# Patient Record
Sex: Male | Born: 1960 | Race: Black or African American | Hispanic: No | Marital: Married | Smoking: Never smoker
Health system: Southern US, Community
[De-identification: ages and names within clinical notes are randomized; demographics above are authoritative.]

## PROBLEM LIST (undated history)

## (undated) DIAGNOSIS — I10 Essential (primary) hypertension: Secondary | ICD-10-CM

## (undated) DIAGNOSIS — J45909 Unspecified asthma, uncomplicated: Secondary | ICD-10-CM

## (undated) DIAGNOSIS — Z87442 Personal history of urinary calculi: Secondary | ICD-10-CM

## (undated) DIAGNOSIS — N200 Calculus of kidney: Secondary | ICD-10-CM

## (undated) HISTORY — DX: Essential (primary) hypertension: I10

## (undated) HISTORY — DX: Calculus of kidney: N20.0

## (undated) HISTORY — DX: Unspecified asthma, uncomplicated: J45.909

---

## 2000-01-20 HISTORY — PX: ANAL FISSURE REPAIR: SHX2312

## 2003-01-20 HISTORY — PX: LITHOTRIPSY: SUR834

## 2005-11-11 ENCOUNTER — Observation Stay (HOSPITAL_COMMUNITY): Admission: RE | Admit: 2005-11-11 | Discharge: 2005-11-12 | Payer: Self-pay | Admitting: Urology

## 2010-09-15 ENCOUNTER — Encounter (INDEPENDENT_AMBULATORY_CARE_PROVIDER_SITE_OTHER): Payer: Self-pay | Admitting: *Deleted

## 2010-09-24 ENCOUNTER — Encounter: Payer: Self-pay | Admitting: Gastroenterology

## 2010-10-17 ENCOUNTER — Ambulatory Visit (AMBULATORY_SURGERY_CENTER): Payer: Self-pay | Admitting: *Deleted

## 2010-10-17 VITALS — Ht 75.0 in | Wt 253.2 lb

## 2010-10-17 DIAGNOSIS — Z1211 Encounter for screening for malignant neoplasm of colon: Secondary | ICD-10-CM

## 2010-10-17 MED ORDER — PEG-KCL-NACL-NASULF-NA ASC-C 100 G PO SOLR: ORAL | Status: DC

## 2010-10-31 ENCOUNTER — Ambulatory Visit (AMBULATORY_SURGERY_CENTER): Payer: BC Managed Care – PPO | Admitting: Gastroenterology

## 2010-10-31 ENCOUNTER — Encounter: Payer: Self-pay | Admitting: Gastroenterology

## 2010-10-31 VITALS — HR 71 | Temp 97.4°F | Resp 18 | Ht 75.0 in | Wt 253.0 lb

## 2010-10-31 DIAGNOSIS — K648 Other hemorrhoids: Secondary | ICD-10-CM

## 2010-10-31 DIAGNOSIS — K644 Residual hemorrhoidal skin tags: Secondary | ICD-10-CM

## 2010-10-31 DIAGNOSIS — Z1211 Encounter for screening for malignant neoplasm of colon: Secondary | ICD-10-CM

## 2010-10-31 DIAGNOSIS — K5730 Diverticulosis of large intestine without perforation or abscess without bleeding: Secondary | ICD-10-CM

## 2010-10-31 HISTORY — PX: COLONOSCOPY: SHX174

## 2010-10-31 MED ORDER — SODIUM CHLORIDE 0.9 % IV SOLN: 500.0000 mL | INTRAVENOUS | Status: DC

## 2010-10-31 NOTE — Patient Instructions (Signed)
Please refer to blue and green discharge instruction sheets. 

## 2010-11-03 ENCOUNTER — Telehealth: Payer: Self-pay | Admitting: *Deleted

## 2010-11-03 NOTE — Telephone Encounter (Signed)
Spoke with Timothy Hahn who states that [name] was not there. She states that "he did great" and they had no questions or concerns.

## 2014-12-18 ENCOUNTER — Ambulatory Visit (INDEPENDENT_AMBULATORY_CARE_PROVIDER_SITE_OTHER): Payer: BLUE CROSS/BLUE SHIELD | Admitting: Urology

## 2014-12-18 DIAGNOSIS — R350 Frequency of micturition: Secondary | ICD-10-CM

## 2014-12-18 DIAGNOSIS — R3915 Urgency of urination: Secondary | ICD-10-CM

## 2014-12-18 DIAGNOSIS — N401 Enlarged prostate with lower urinary tract symptoms: Secondary | ICD-10-CM | POA: Diagnosis not present

## 2015-02-19 ENCOUNTER — Ambulatory Visit (INDEPENDENT_AMBULATORY_CARE_PROVIDER_SITE_OTHER): Payer: BLUE CROSS/BLUE SHIELD | Admitting: Urology

## 2015-02-19 DIAGNOSIS — N401 Enlarged prostate with lower urinary tract symptoms: Secondary | ICD-10-CM

## 2016-02-04 ENCOUNTER — Ambulatory Visit (INDEPENDENT_AMBULATORY_CARE_PROVIDER_SITE_OTHER): Payer: BLUE CROSS/BLUE SHIELD | Admitting: Urology

## 2016-02-04 DIAGNOSIS — N5201 Erectile dysfunction due to arterial insufficiency: Secondary | ICD-10-CM | POA: Diagnosis not present

## 2016-02-04 DIAGNOSIS — N401 Enlarged prostate with lower urinary tract symptoms: Secondary | ICD-10-CM

## 2017-11-05 ENCOUNTER — Other Ambulatory Visit (HOSPITAL_COMMUNITY): Payer: Self-pay | Admitting: Nurse Practitioner

## 2017-11-05 DIAGNOSIS — R1906 Epigastric swelling, mass or lump: Secondary | ICD-10-CM

## 2017-11-08 ENCOUNTER — Ambulatory Visit (HOSPITAL_COMMUNITY)
Admission: RE | Admit: 2017-11-08 | Discharge: 2017-11-08 | Disposition: A | Discharge status: Home / Self Care | Payer: Commercial Managed Care - PPO | Source: Ambulatory Visit | Attending: Nurse Practitioner | Admitting: Nurse Practitioner

## 2017-11-08 ENCOUNTER — Encounter (HOSPITAL_COMMUNITY): Payer: Self-pay

## 2017-11-08 DIAGNOSIS — N281 Cyst of kidney, acquired: Secondary | ICD-10-CM | POA: Diagnosis not present

## 2017-11-08 DIAGNOSIS — R1900 Intra-abdominal and pelvic swelling, mass and lump, unspecified site: Secondary | ICD-10-CM | POA: Insufficient documentation

## 2017-11-08 DIAGNOSIS — I2510 Atherosclerotic heart disease of native coronary artery without angina pectoris: Secondary | ICD-10-CM | POA: Insufficient documentation

## 2017-11-08 DIAGNOSIS — K8020 Calculus of gallbladder without cholecystitis without obstruction: Secondary | ICD-10-CM | POA: Diagnosis not present

## 2017-11-08 DIAGNOSIS — K769 Liver disease, unspecified: Secondary | ICD-10-CM | POA: Diagnosis not present

## 2017-11-08 DIAGNOSIS — K5710 Diverticulosis of small intestine without perforation or abscess without bleeding: Secondary | ICD-10-CM | POA: Diagnosis not present

## 2017-11-08 DIAGNOSIS — R1906 Epigastric swelling, mass or lump: Secondary | ICD-10-CM

## 2017-11-08 LAB — POCT I-STAT CREATININE: CREATININE: 1.2 mg/dL (ref 0.61–1.24)

## 2017-11-08 MED ORDER — IOPAMIDOL (ISOVUE-300) INJECTION 61%
100.0000 mL | Freq: Once | INTRAVENOUS | Status: AC | PRN
  Administered 2017-11-08: 100 mL via INTRAVENOUS

## 2017-11-15 ENCOUNTER — Other Ambulatory Visit (HOSPITAL_COMMUNITY): Payer: Self-pay | Admitting: Internal Medicine

## 2017-11-15 DIAGNOSIS — K7689 Other specified diseases of liver: Secondary | ICD-10-CM

## 2017-11-18 ENCOUNTER — Ambulatory Visit (HOSPITAL_COMMUNITY)
Admission: RE | Admit: 2017-11-18 | Discharge: 2017-11-18 | Disposition: A | Discharge status: Home / Self Care | Payer: Commercial Managed Care - PPO | Source: Ambulatory Visit | Attending: Internal Medicine | Admitting: Internal Medicine

## 2017-11-18 DIAGNOSIS — K8020 Calculus of gallbladder without cholecystitis without obstruction: Secondary | ICD-10-CM | POA: Diagnosis not present

## 2017-11-18 DIAGNOSIS — I517 Cardiomegaly: Secondary | ICD-10-CM | POA: Diagnosis not present

## 2017-11-18 DIAGNOSIS — J90 Pleural effusion, not elsewhere classified: Secondary | ICD-10-CM | POA: Diagnosis not present

## 2017-11-18 DIAGNOSIS — K7689 Other specified diseases of liver: Secondary | ICD-10-CM | POA: Diagnosis present

## 2017-11-18 DIAGNOSIS — K5730 Diverticulosis of large intestine without perforation or abscess without bleeding: Secondary | ICD-10-CM | POA: Diagnosis not present

## 2017-11-18 DIAGNOSIS — I700 Atherosclerosis of aorta: Secondary | ICD-10-CM | POA: Insufficient documentation

## 2017-11-18 DIAGNOSIS — N281 Cyst of kidney, acquired: Secondary | ICD-10-CM | POA: Diagnosis not present

## 2017-11-18 MED ORDER — GADOBUTROL 1 MMOL/ML IV SOLN
20.0000 mL | Freq: Once | INTRAVENOUS | Status: AC | PRN
  Administered 2017-11-18: 10 mL via INTRAVENOUS

## 2019-01-11 IMAGING — MR MR ABDOMEN WO/W CM
9 of 18 series · 21 of 48 positions shown · IV contrast (10ml gadavist)
Comparison: Multiple exams, including 11/08/2017

CLINICAL DATA: Indeterminate liver lesions on recent CT abdomen of
11/08/2017 for definitive characterization.

EXAM:
MRI ABDOMEN WITHOUT AND WITH CONTRAST
TECHNIQUE: Multiplanar multisequence MR imaging of the abdomen was performed
both before and after the administration of intravenous contrast.
CONTRAST:  10 cc Gadavist

[Series 4: T2 · coronal · 5.0mm · 1.20mm/px · 1 of 40 slices shown]
[im 1/40]
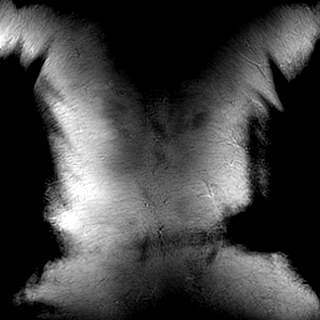

[Series 5: t2fs axial bh · axial · 5.0mm · 1.50mm/px · z∈[-57,+177]mm · 2 of 40 slices shown]
[im 1/40]
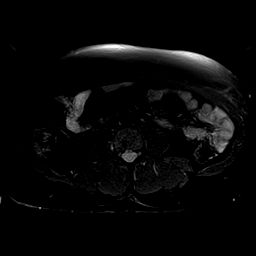
[im 40/40]
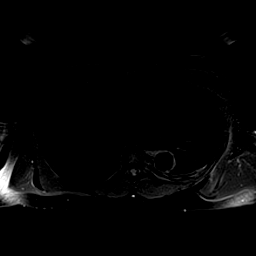

[Series 6: bSSFP · axial · 4.0mm · 0.68mm/px · z∈[-91,+185]mm · 3 of 70 slices shown]
[im 1/70]
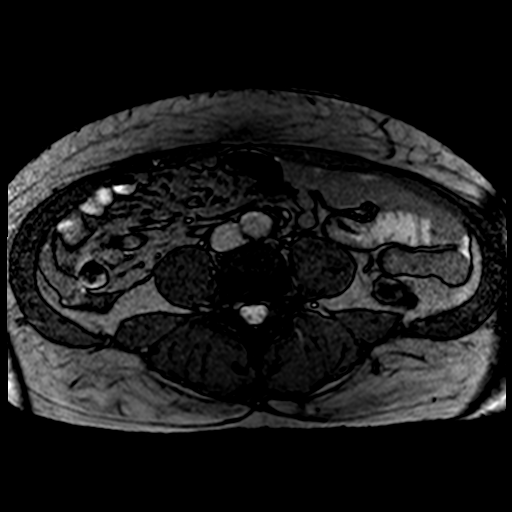
[im 35/70]
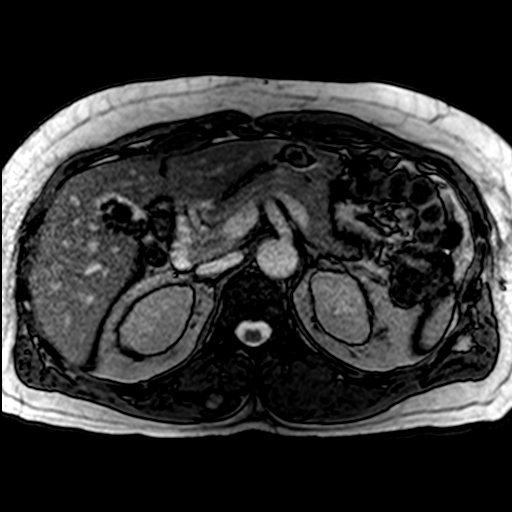
[im 70/70]
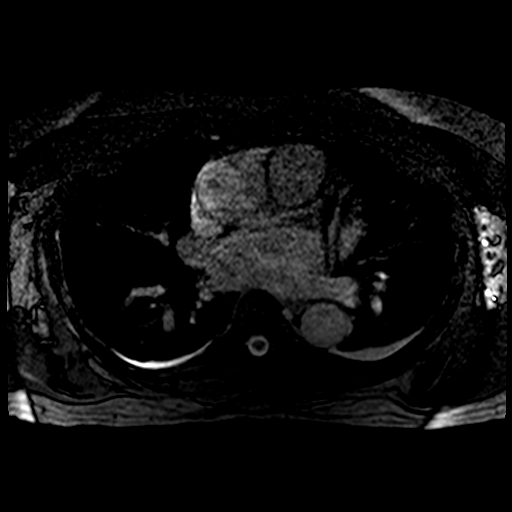

[Series 7: DWI · axial · 5.0mm · 0.99mm/px · z∈[-88,+182]mm · 3 of 92 slices shown (1 of 2)]
[im 1/92]
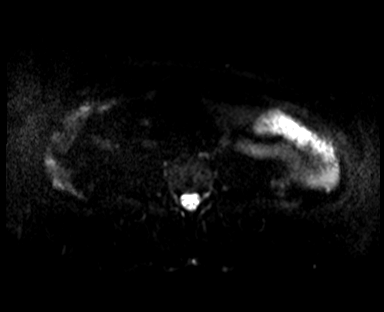
[im 46/92]
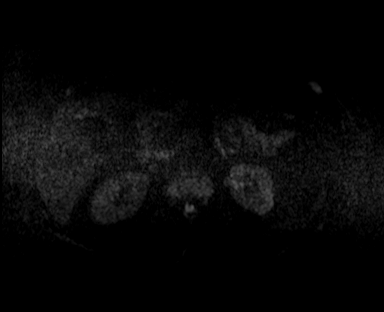
[im 92/92]
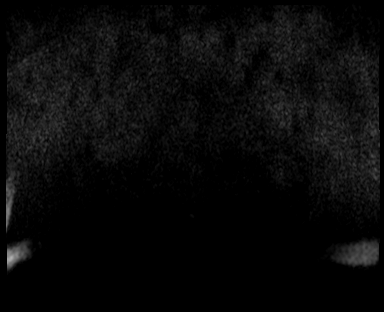

[Series 8: DWI · axial · 5.0mm · 0.99mm/px · z∈[-88,+182]mm · 2 of 46 slices shown (2 of 2)]
[im 1/46]
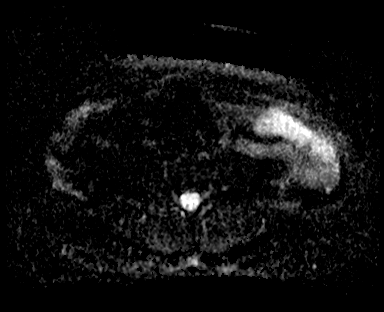
[im 46/46]
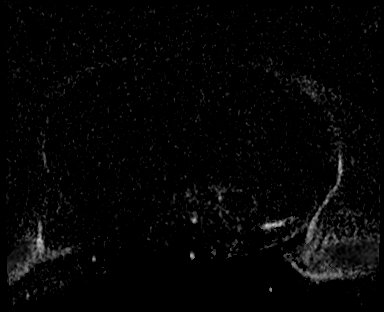

[Series 9: T1 · axial · 4.0mm · 0.59mm/px · z∈[-128,+124]mm · 2 of 64 slices shown (1 of 3)]
[im 1/64]
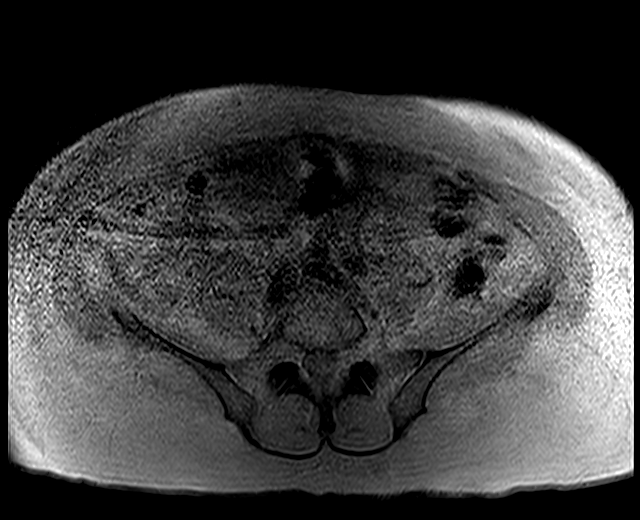
[im 64/64]
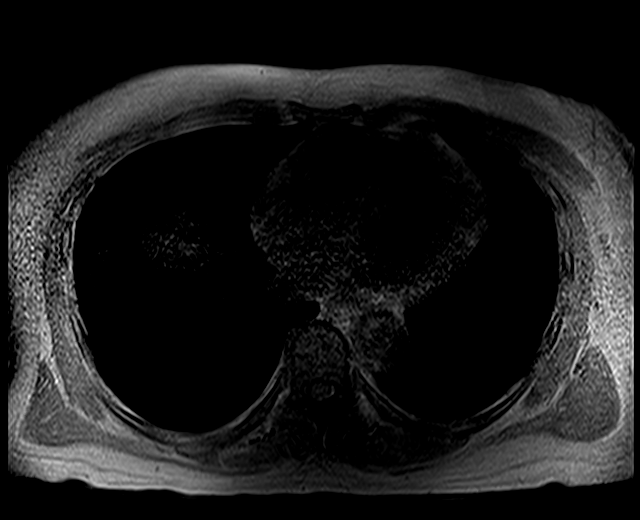

[Series 10: T1 · axial · 4.0mm · 0.59mm/px · z∈[-128,+124]mm · 2 of 64 slices shown (2 of 3)]
[im 1/64]
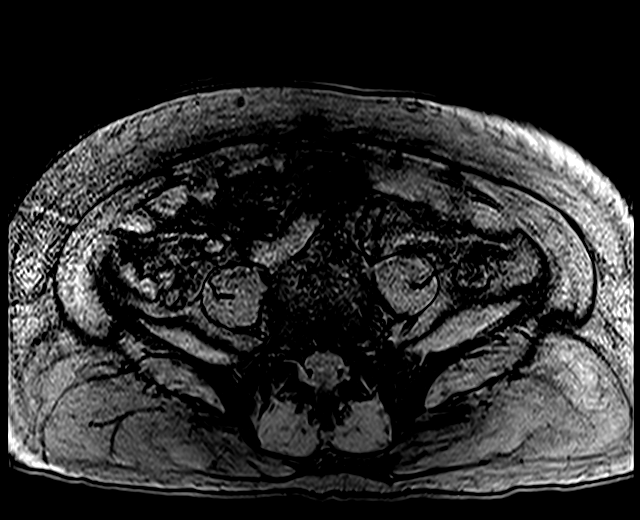
[im 64/64]
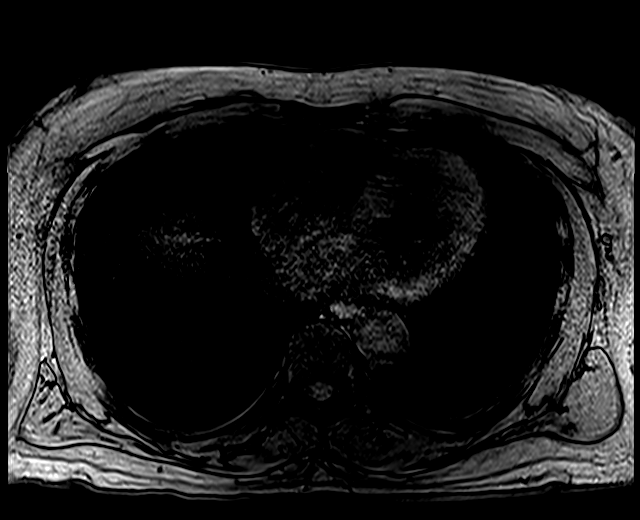

[Series 13: T1 · axial · non-contrast · 3.7mm · 0.62mm/px · z∈[-149,+144]mm · 3 of 80 slices shown (3 of 3)]
[im 1/80]
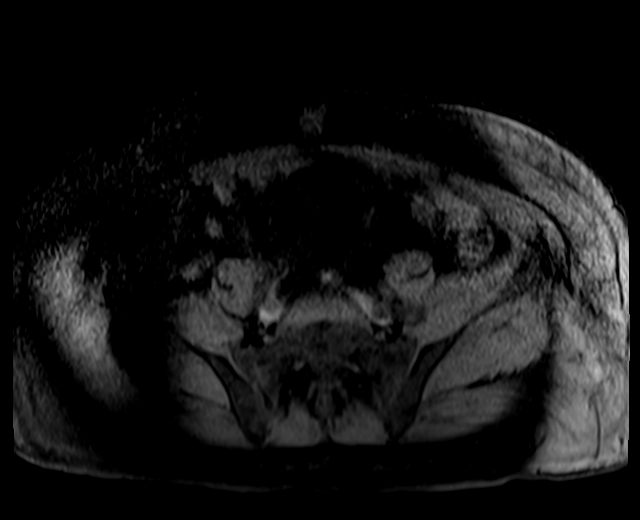
[im 40/80]
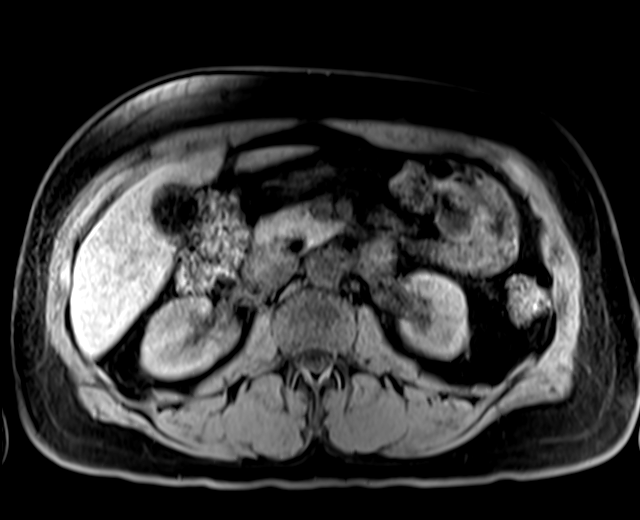
[im 80/80]
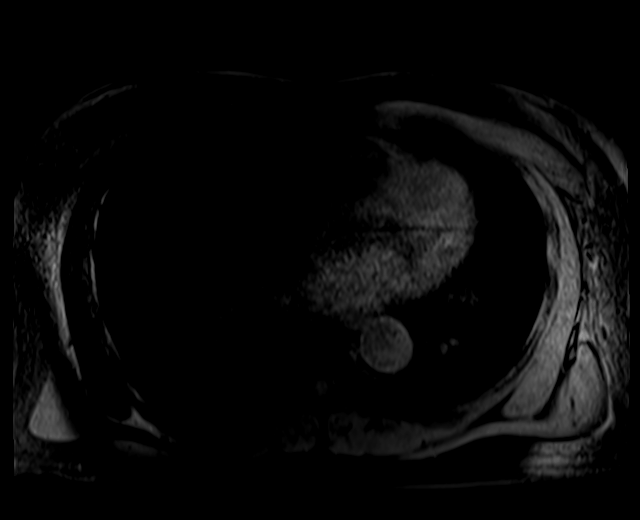

[Series 14: T1 post-contrast · axial · 3.7mm · 0.62mm/px · z∈[-149,+144]mm · 3 of 80 slices shown]
[im 1/80]
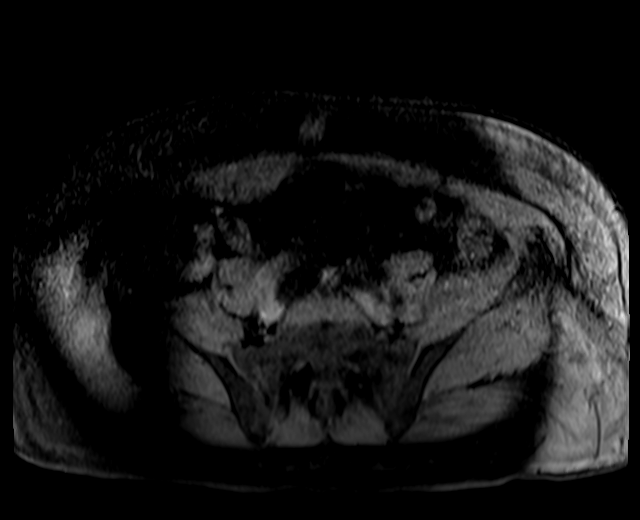
[im 40/80]
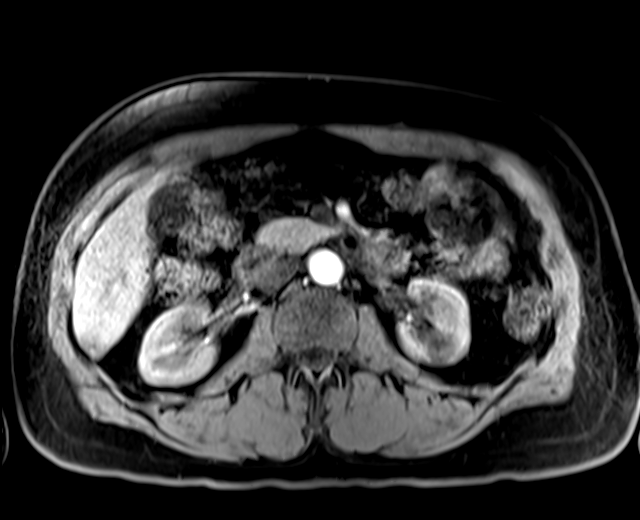
[im 80/80]
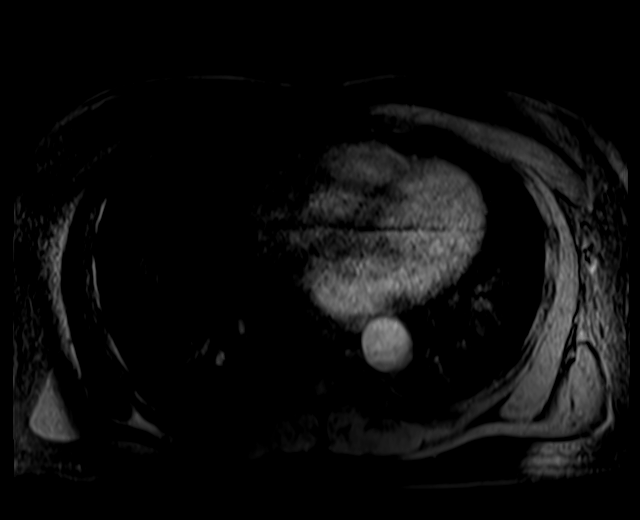

[21 of 48 positions shown; findings below may reference images not displayed]

FINDINGS: Despite efforts by the technologist and patient, motion artifact is
present on today's exam and could not be eliminated. This reduces
exam sensitivity and specificity.

Lower chest: Trace bilateral pleural effusions.  Mild cardiomegaly.

Hepatobiliary: The 3 lesions in the liver demonstrate faintly
increased T2 signal, reduced T1 signal, and indistinct enhancement
on later phase images along with delayed enhancement on the
five-minute images. Moreover, these lesions were larger and had
characteristic enhancement pattern for hemangioma is back on
06/03/2006, 11 years ago. Appearance compatible with hemangiomas,
likely with mildly atypical characteristics due to interval
involution.

Numerous gallstones fill the gallbladder. These are of variable size
but as large as 1.6 cm in long axis. No biliary dilatation. No new
liver lesions.

Pancreas:  Unremarkable

Spleen:  Unremarkable

Adrenals/Urinary Tract: Small left renal cysts include a 7 mm
Bosniak category 2 cyst of the left kidney upper pole on image
35/13. The right kidney appears unremarkable. Adrenal glands normal.

Stomach/Bowel: Sigmoid colon diverticulosis. Normal appendix.
Periampullary duodenal diverticulum suspected.

Vascular/Lymphatic:  Aortoiliac atherosclerotic vascular disease.

Other:  No supplemental non-categorized findings.

Musculoskeletal: Disc desiccation at L5-S1.
IMPRESSION: 1. The 3 liver lesions seen on the CT scan are thought to represent
involuted hemangiomas. These have delayed enhancement, faintly
increased T2 signal, and were larger and had a classic hemangioma
appearance on a remote prior CT scan from 06/03/2006.
2. Numerous gallstones fill the gallbladder.
3. Bosniak category 2 complex but benign 7 mm cyst of the left
kidney upper pole.
4. Trace bilateral pleural effusions with mild cardiomegaly.
5.  Aortic Atherosclerosis ([id number]).
6. Sigmoid colon diverticulosis.

## 2020-08-07 ENCOUNTER — Encounter: Payer: Self-pay | Admitting: Gastroenterology

## 2020-10-15 ENCOUNTER — Other Ambulatory Visit: Payer: Self-pay

## 2020-10-15 ENCOUNTER — Encounter: Payer: Self-pay | Admitting: Urology

## 2020-10-15 ENCOUNTER — Ambulatory Visit (INDEPENDENT_AMBULATORY_CARE_PROVIDER_SITE_OTHER): Payer: Commercial Managed Care - PPO | Admitting: Urology

## 2020-10-15 VITALS — BP 173/120 | HR 60 | Temp 98.0°F | Wt 227.0 lb

## 2020-10-15 DIAGNOSIS — R339 Retention of urine, unspecified: Secondary | ICD-10-CM | POA: Diagnosis not present

## 2020-10-15 DIAGNOSIS — N400 Enlarged prostate without lower urinary tract symptoms: Secondary | ICD-10-CM

## 2020-10-15 LAB — URINALYSIS, ROUTINE W REFLEX MICROSCOPIC
Bilirubin, UA: NEGATIVE
Glucose, UA: NEGATIVE
Ketones, UA: NEGATIVE
Leukocytes,UA: NEGATIVE
Nitrite, UA: NEGATIVE
Protein,UA: NEGATIVE
RBC, UA: NEGATIVE
Specific Gravity, UA: 1.025 (ref 1.005–1.030)
Urobilinogen, Ur: 0.2 mg/dL (ref 0.2–1.0)
pH, UA: 5.5 (ref 5.0–7.5)

## 2020-10-15 NOTE — Progress Notes (Signed)
Urological Symptom Review  Patient is experiencing the following symptoms: Frequent urination Hard to postpone urination Get up at night to urinate Trouble starting stream Have to strain to urinate Weak stream   Review of Systems  Gastrointestinal (upper)  : Negative for upper GI symptoms  Gastrointestinal (lower) : Negative for lower GI symptoms  Constitutional : Negative for symptoms  Skin: Negative for skin symptoms  Eyes: Negative for eye symptoms  Ear/Nose/Throat : Negative for Ear/Nose/Throat symptoms  Hematologic/Lymphatic: Negative for Hematologic/Lymphatic symptoms  Cardiovascular : Negative for cardiovascular symptoms  Respiratory : Negative for respiratory symptoms  Endocrine: Negative for endocrine symptoms  Musculoskeletal: Negative for musculoskeletal symptoms  Neurological: Negative for neurological symptoms  Psychologic: Negative for psychiatric symptoms

## 2020-10-15 NOTE — Progress Notes (Signed)
History of Present Illness: 59-year-old male presents for follow-up of BPH.  I first saw him back in 2016.  At that time he had significant lower urinary tract symptoms.  He has been on maximum dose of tamsulosin since that time.  His last visit was over 3 years ago.  Since that time, he has had worsening of urinary symptomatology.  He is on tamsulosin 0.4 mg 2 a day.  With that, IPSS 17, quality-of-life score 4.  At his visit in 2016 he did have cystoscopy which revealed an obstructive prostate.  Past Medical History:  Diagnosis Date   Gout    Hypertension    Kidney stones     Past Surgical History:  Procedure Laterality Date   ANAL FISSURE REPAIR  2002   LITHOTRIPSY  2005   kidney stones    Home Medications:  Allergies as of 10/15/2020   No Known Allergies      Medication List        Accurate as of October 15, 2020 10:54 AM. If you have any questions, ask your nurse or doctor.          amLODipine 5 MG tablet Commonly known as: NORVASC Take 5 mg by mouth daily.   aspirin 81 MG tablet Take 81 mg by mouth daily.   benazepril 40 MG tablet Commonly known as: LOTENSIN Take 40 mg by mouth daily.   tamsulosin 0.4 MG Caps capsule Commonly known as: FLOMAX 0.4 mg daily after supper.        Allergies: No Known Allergies  Family History  Problem Relation Age of Onset   Multiple myeloma Mother    ALS Sister    Colon cancer Neg Hx    Stomach cancer Neg Hx    Rectal cancer Neg Hx     Social History:  reports that he does not drink alcohol and does not use drugs. No history on file for tobacco use.  ROS: A complete review of systems was performed.  All systems are negative except for pertinent findings as noted.  Physical Exam:  Vital signs in last 24 hours: There were no vitals taken for this visit. Constitutional:  Alert and oriented, No acute distress Cardiovascular: Regular rate  Respiratory: Normal respiratory effort GI: Abdomen is soft,  nontender, nondistended, no abdominal masses. No CVAT.  Genitourinary: Normal male phallus, testes are descended bilaterally and non-tender and without masses, scrotum is normal in appearance without lesions or masses, perineum is normal on inspection.  Prostate 50 g.  Normal anal sphincter tone.  No prostatic nodularity Lymphatic: No lymphadenopathy Neurologic: Grossly intact, no focal deficits Psychiatric: Normal mood and affect  I have reviewed prior pt notes  I have reviewed notes from referring/previous physicians--Dr. Vyas  I have independently reviewed bladder scan--427 mL  I have reviewed prior PSA results    Impression/Assessment:  1.  BPH with significant lower urinary tract symptoms, incomplete emptying, on maximal medical therapy  2.  Screening for prostate cancer  Plan:  1.  PSA is checked today  2.  I will have him come back for cystoscopy after prostate ultrasound.  We will discuss further intervention i.e. TURP versus UroLift, depending on the size of his prostate  

## 2020-10-16 LAB — PSA: Prostate Specific Ag, Serum: 0.6 ng/mL (ref 0.0–4.0)

## 2020-10-16 NOTE — Progress Notes (Signed)
Results sent via my chart 

## 2020-10-18 ENCOUNTER — Encounter: Payer: Self-pay | Admitting: Urology

## 2020-10-21 NOTE — Telephone Encounter (Signed)
Ultrasound appointment has been scheduled for Tues, 11/2 at 1:30 pm at [facility] radiology department.   Follow up appt with Dr. Retta Diones is scheduled for Wed. 11/3 at 2:00pm at the [facility] office in [place].   If you have any questions or need any additional information, please contact the office at [phone].

## 2020-10-30 ENCOUNTER — Other Ambulatory Visit: Payer: Self-pay

## 2020-10-30 ENCOUNTER — Ambulatory Visit (AMBULATORY_SURGERY_CENTER): Payer: Commercial Managed Care - PPO | Admitting: *Deleted

## 2020-10-30 ENCOUNTER — Encounter: Payer: Self-pay | Admitting: Gastroenterology

## 2020-10-30 VITALS — Ht 73.0 in | Wt 230.0 lb

## 2020-10-30 DIAGNOSIS — Z1211 Encounter for screening for malignant neoplasm of colon: Secondary | ICD-10-CM

## 2020-10-30 MED ORDER — PEG-KCL-NACL-NASULF-NA ASC-C 100 G PO SOLR: 1.0000 | Freq: Once | ORAL | 0 refills | Status: AC

## 2020-10-30 NOTE — Progress Notes (Signed)

## 2020-11-13 ENCOUNTER — Other Ambulatory Visit: Payer: Self-pay

## 2020-11-13 ENCOUNTER — Ambulatory Visit (AMBULATORY_SURGERY_CENTER): Payer: Commercial Managed Care - PPO | Admitting: Gastroenterology

## 2020-11-13 ENCOUNTER — Encounter: Payer: Self-pay | Admitting: Gastroenterology

## 2020-11-13 VITALS — BP 169/108 | HR 69 | Temp 99.6°F | Resp 10 | Ht 72.0 in | Wt 230.0 lb

## 2020-11-13 DIAGNOSIS — Z1211 Encounter for screening for malignant neoplasm of colon: Secondary | ICD-10-CM | POA: Diagnosis not present

## 2020-11-13 MED ORDER — SODIUM CHLORIDE 0.9 % IV SOLN: 500.0000 mL | Freq: Once | INTRAVENOUS | Status: DC

## 2020-11-13 NOTE — Progress Notes (Signed)
HPI: This is a man with routine risk for CRC   ROS: complete GI ROS as described in HPI, all other review negative.  Constitutional:  No unintentional weight loss   Past Medical History:  Diagnosis Date   Asthma    as a child only   Gout    Hypertension    Kidney stones     Past Surgical History:  Procedure Laterality Date   ANAL FISSURE REPAIR  01/20/2000   COLONOSCOPY  10/31/2010   Dr.Aarthi Uyeno   LITHOTRIPSY  01/20/2003   kidney stones    Current Outpatient Medications  Medication Sig Dispense Refill   acetaminophen (TYLENOL) 500 MG tablet Take 500 mg by mouth every 6 (six) hours as needed.     allopurinol (ZYLOPRIM) 300 MG tablet Take 300 mg by mouth daily.     carvedilol (COREG) 25 MG tablet Take 25 mg by mouth 2 (two) times daily.     Cholecalciferol (VITAMIN D3 PO) Take by mouth.     finasteride (PROSCAR) 5 MG tablet Take 5 mg by mouth daily.     hydrALAZINE (APRESOLINE) 25 MG tablet Take 25 mg by mouth 2 (two) times daily.     Tamsulosin HCl (FLOMAX) 0.4 MG CAPS 0.4 mg daily after supper.      Current Facility-Administered Medications  Medication Dose Route Frequency Provider Last Rate Last Admin   0.9 %  sodium chloride infusion  500 mL Intravenous Once Milus Banister, MD        Allergies as of 11/13/2020 - Review Complete 11/13/2020  Allergen Reaction Noted   Bactrim [sulfamethoxazole-trimethoprim] Nausea Only 10/30/2020   Benazepril hcl Swelling 10/30/2020   Norvasc [amlodipine] Other (See Comments) 10/30/2020    Family History  Problem Relation Age of Onset   Multiple myeloma Mother    ALS Sister    Colon cancer Neg Hx    Stomach cancer Neg Hx    Rectal cancer Neg Hx    Esophageal cancer Neg Hx    Colon polyps Neg Hx     Social History   Socioeconomic History   Marital status: Married    Spouse name: Not on file   Number of children: Not on file   Years of education: Not on file   Highest education level: Not on file  Occupational History    Not on file  Tobacco Use   Smoking status: Never   Smokeless tobacco: Never  Vaping Use   Vaping Use: Never used  Substance and Sexual Activity   Alcohol use: No   Drug use: No   Sexual activity: Not on file  Other Topics Concern   Not on file  Social History Narrative   Not on file   Social Determinants of Health   Financial Resource Strain: Not on file  Food Insecurity: Not on file  Transportation Needs: Not on file  Physical Activity: Not on file  Stress: Not on file  Social Connections: Not on file  Intimate Partner Violence: Not on file     Physical Exam: BP (!) 168/110   Pulse 72   Temp 99.6 F (37.6 C) (Skin)   Ht 6' (1.829 m)   Wt 230 lb (104.3 kg)   SpO2 98%   BMI 31.19 kg/m  Constitutional: generally well-appearing Psychiatric: alert and oriented x3 Lungs: CTA bilaterally Heart: no MCR  Assessment and plan: 60 y.o. male with routine risk for CRC  Screenign colonoscaopy today.  Care is appropriate for the ambulatory setting.  Quillian Quince  Ardis Hughs, MD [facility] 11/13/2020, 9:51 AM

## 2020-11-13 NOTE — Progress Notes (Signed)
VS by Falman  Pt's states no medical or surgical changes since previsit or office visit.  

## 2020-11-13 NOTE — Op Note (Signed)
[facility] Patient Name: Timothy Hahn Procedure Date: 11/13/2020 9:59 AM MRN: [id number] Endoscopist: Rachael Fee , MD Age: 60 Referring MD:  Date of Birth: Nov 29, 1960 Gender: Male Account #: [id number] Procedure:                Colonoscopy Indications:              Screening for colorectal malignant neoplasm Medicines:                Monitored Anesthesia Care Procedure:                Pre-Anesthesia Assessment:                           - Prior to the procedure, a History and Physical                            was performed, and patient medications and                            allergies were reviewed. The patient's tolerance of                            previous anesthesia was also reviewed. The risks                            and benefits of the procedure and the sedation                            options and risks were discussed with the patient.                            All questions were answered, and informed consent                            was obtained. Prior Anticoagulants: The patient has                            taken no previous anticoagulant or antiplatelet                            agents. ASA Grade Assessment: II - A patient with                            mild systemic disease. After reviewing the risks                            and benefits, the patient was deemed in                            satisfactory condition to undergo the procedure.                           After obtaining informed consent, the colonoscope  was passed under direct vision. Throughout the                            procedure, the patient's blood pressure, pulse, and                            oxygen saturations were monitored continuously. The                            Olympus CF-HQ190L [id number]) Colonoscope was                            introduced through the anus and advanced to the the                            cecum,  identified by appendiceal orifice and                            ileocecal valve. The colonoscopy was performed                            without difficulty. The patient tolerated the                            procedure well. The quality of the bowel                            preparation was good. The ileocecal valve,                            appendiceal orifice, and rectum were photographed. Scope In: 10:02:03 AM Scope Out: 10:12:57 AM Scope Withdrawal Time: 0 hours 7 minutes 12 seconds  Total Procedure Duration: 0 hours 10 minutes 54 seconds  Findings:                 Multiple small and large-mouthed diverticula were                            found in the left colon.                           The exam was otherwise without abnormality on                            direct and retroflexion views. Complications:            No immediate complications. Estimated blood loss:                            None. Estimated Blood Loss:     Estimated blood loss: none. Impression:               - Diverticulosis in the left colon.                           - The examination was otherwise normal on  direct                            and retroflexion views.                           - No polyps or cancers. Recommendation:           - Patient has a contact number available for                            emergencies. The signs and symptoms of potential                            delayed complications were discussed with the                            patient. Return to normal activities tomorrow.                            Written discharge instructions were provided to the                            patient.                           - Resume previous diet.                           - Continue present medications.                           - Repeat colonoscopy in 10 years for screening. Rachael Fee, MD 11/13/2020 10:15:04 AM This report has been signed electronically.

## 2020-11-13 NOTE — Progress Notes (Signed)
Pt Drowsy. VSS. To PACU, report to RN. No anesthetic complications noted.  

## 2020-11-13 NOTE — Patient Instructions (Signed)
Discharge instructions given. Handouts on Diverticulosis. Resume previous medications. YOU HAD AN ENDOSCOPIC PROCEDURE TODAY AT THE  ENDOSCOPY CENTER:   Refer to the procedure report that was given to you for any specific questions about what was found during the examination.  If the procedure report does not answer your questions, please call your gastroenterologist to clarify.  If you requested that your care partner not be given the details of your procedure findings, then the procedure report has been included in a sealed envelope for you to review at your convenience later.  YOU SHOULD EXPECT: Some feelings of bloating in the abdomen. Passage of more gas than usual.  Walking can help get rid of the air that was put into your GI tract during the procedure and reduce the bloating. If you had a lower endoscopy (such as a colonoscopy or flexible sigmoidoscopy) you may notice spotting of blood in your stool or on the toilet paper. If you underwent a bowel prep for your procedure, you may not have a normal bowel movement for a few days.  Please Note:  You might notice some irritation and congestion in your nose or some drainage.  This is from the oxygen used during your procedure.  There is no need for concern and it should clear up in a day or so.  SYMPTOMS TO REPORT IMMEDIATELY:  Following lower endoscopy (colonoscopy or flexible sigmoidoscopy):  Excessive amounts of blood in the stool  Significant tenderness or worsening of abdominal pains  Swelling of the abdomen that is new, acute  Fever of 100F or higher   For urgent or emergent issues, a gastroenterologist can be reached at any hour by calling [phone]. Do not use MyChart messaging for urgent concerns.    DIET:  We do recommend a small meal at first, but then you may proceed to your regular diet.  Drink plenty of fluids but you should avoid alcoholic beverages for 24 hours.  ACTIVITY:  You should plan to take it easy for  the rest of today and you should NOT DRIVE or use heavy machinery until tomorrow (because of the sedation medicines used during the test).    FOLLOW UP: Our staff will call the number listed on your records 48-72 hours following your procedure to check on you and address any questions or concerns that you may have regarding the information given to you following your procedure. If we do not reach you, we will leave a message.  We will attempt to reach you two times.  During this call, we will ask if you have developed any symptoms of COVID 19. If you develop any symptoms (ie: fever, flu-like symptoms, shortness of breath, cough etc.) before then, please call [phone].  If you test positive for Covid 19 in the 2 weeks post procedure, please call and report this information to [place].    If any biopsies were taken you will be contacted by phone or by letter within the next 1-3 weeks.  Please call us at [phone] if you have not heard about the biopsies in 3 weeks.    SIGNATURES/CONFIDENTIALITY: You and/or your care partner have signed paperwork which will be entered into your electronic medical record.  These signatures attest to the fact that that the information above on your After Visit Summary has been reviewed and is understood.  Full responsibility of the confidentiality of this discharge information lies with you and/or your care-partner.

## 2020-11-15 ENCOUNTER — Telehealth: Payer: Self-pay

## 2020-11-15 NOTE — Telephone Encounter (Signed)
  Follow up Call-  Call back number 11/13/2020  Post procedure Call Back phone  # [phone]  Permission to leave phone message Yes  Some recent data might be hidden     Patient questions:  Do you have a fever, pain , or abdominal swelling? No. Pain Score  0 *  Have you tolerated food without any problems? Yes.    Have you been able to return to your normal activities? Yes.    Do you have any questions about your discharge instructions: Diet   No. Medications  No. Follow up visit  No.  Do you have questions or concerns about your Care? No.  Actions: * If pain score is 4 or above: No action needed, pain <4.

## 2020-11-20 ENCOUNTER — Ambulatory Visit (HOSPITAL_COMMUNITY)
Admission: RE | Admit: 2020-11-20 | Discharge: 2020-11-20 | Disposition: A | Discharge status: Home / Self Care | Payer: Commercial Managed Care - PPO | Source: Ambulatory Visit | Attending: Urology | Admitting: Urology

## 2020-11-20 ENCOUNTER — Other Ambulatory Visit: Payer: Self-pay

## 2020-11-20 DIAGNOSIS — N400 Enlarged prostate without lower urinary tract symptoms: Secondary | ICD-10-CM | POA: Diagnosis not present

## 2020-11-25 NOTE — Progress Notes (Signed)
History of Present Illness: Here for cysto to complete BPH evaluation.  Recent prostate volume on U/S 23 mL.      Past Medical History:  Diagnosis Date   Asthma    as a child only   Gout    Hypertension    Kidney stones     Past Surgical History:  Procedure Laterality Date   ANAL FISSURE REPAIR  01/20/2000   COLONOSCOPY  10/31/2010   Dr.Jacobs   LITHOTRIPSY  01/20/2003   kidney stones    Home Medications:  Allergies as of 11/26/2020       Reactions   Bactrim [sulfamethoxazole-trimethoprim] Nausea Only   Benazepril Hcl Swelling   angioedema   Norvasc [amlodipine] Other (See Comments)   edema        Medication List        Accurate as of November 25, 2020  1:33 PM. If you have any questions, ask your nurse or doctor.          acetaminophen 500 MG tablet Commonly known as: TYLENOL Take 500 mg by mouth every 6 (six) hours as needed.   allopurinol 300 MG tablet Commonly known as: ZYLOPRIM Take 300 mg by mouth daily.   carvedilol 25 MG tablet Commonly known as: COREG Take 25 mg by mouth 2 (two) times daily.   finasteride 5 MG tablet Commonly known as: PROSCAR Take 5 mg by mouth daily.   hydrALAZINE 25 MG tablet Commonly known as: APRESOLINE Take 25 mg by mouth 2 (two) times daily.   tamsulosin 0.4 MG Caps capsule Commonly known as: FLOMAX 0.4 mg daily after supper.   VITAMIN D3 PO Take by mouth.        Allergies:  Allergies  Allergen Reactions   Bactrim [Sulfamethoxazole-Trimethoprim] Nausea Only   Benazepril Hcl Swelling    angioedema   Norvasc [Amlodipine] Other (See Comments)    edema    Family History  Problem Relation Age of Onset   Multiple myeloma Mother    ALS Sister    Colon cancer Neg Hx    Stomach cancer Neg Hx    Rectal cancer Neg Hx    Esophageal cancer Neg Hx    Colon polyps Neg Hx     Social History:  reports that he has never smoked. He has never used smokeless tobacco. He reports that he does not drink  alcohol and does not use drugs.  ROS: A complete review of systems was performed.  All systems are negative except for pertinent findings as noted.  Physical Exam:  Vital signs in last 24 hours: There were no vitals taken for this visit. Constitutional:  Alert and oriented, No acute distress Cardiovascular: Regular rate  Respiratory: Normal respiratory effort GI: Abdomen is soft, nontender, nondistended, no abdominal masses. No CVAT.  Genitourinary: Normal male phallus, testes are descended bilaterally and non-tender and without masses, scrotum is normal in appearance without lesions or masses, perineum is normal on inspection. Lymphatic: No lymphadenopathy Neurologic: Grossly intact, no focal deficits Psychiatric: Normal mood and affect  I have reviewed prior pt notes  I have reviewed urinalysis results  I have independently reviewed prior imaging  I have reviewed prior PSA results   Cystoscopy Procedure Note:  Indication: BPH evaluation  After informed consent and discussion of the procedure and its risks, Timothy Hahn was positioned and prepped in the standard fashion.  Cystoscopy was performed with a flexible cystoscope.   Findings: Urethra: No lesions/stricture Prostate: Obstructive with bilobar hypertrophy.  Slight  elevated bladder neck Bladder neck: No contracture Ureteral orifices: Normal bilaterally Bladder: Mild to moderate trabeculations.  Small diverticulum at the left dome.  Urothelium normal.  The patient tolerated the procedure well.     Impression/Assessment:  BPH with obstruction, worsening  Plan:  I discussed treatment options with the patient, TURP, TUIP, UroLift, Rezum.  He would like to consider minimally invasive procedures such as UroLift.  I discussed the procedure, risk, complication and expected outcomes.  He will research these, and get back with [place] about which way he would like to proceed.

## 2020-11-26 ENCOUNTER — Other Ambulatory Visit: Payer: Self-pay

## 2020-11-26 ENCOUNTER — Ambulatory Visit (INDEPENDENT_AMBULATORY_CARE_PROVIDER_SITE_OTHER): Payer: Commercial Managed Care - PPO | Admitting: Urology

## 2020-11-26 ENCOUNTER — Encounter: Payer: Self-pay | Admitting: Urology

## 2020-11-26 VITALS — BP 177/115 | HR 62

## 2020-11-26 DIAGNOSIS — R339 Retention of urine, unspecified: Secondary | ICD-10-CM | POA: Diagnosis not present

## 2020-11-26 DIAGNOSIS — N400 Enlarged prostate without lower urinary tract symptoms: Secondary | ICD-10-CM | POA: Diagnosis not present

## 2020-11-26 LAB — URINALYSIS, ROUTINE W REFLEX MICROSCOPIC
Bilirubin, UA: NEGATIVE
Glucose, UA: NEGATIVE
Ketones, UA: NEGATIVE
Leukocytes,UA: NEGATIVE
Nitrite, UA: NEGATIVE
Protein,UA: NEGATIVE
RBC, UA: NEGATIVE
Specific Gravity, UA: 1.025 (ref 1.005–1.030)
Urobilinogen, Ur: 0.2 mg/dL (ref 0.2–1.0)
pH, UA: 7 (ref 5.0–7.5)

## 2020-11-26 MED ORDER — CIPROFLOXACIN HCL 500 MG PO TABS
500.0000 mg | ORAL_TABLET | Freq: Once | ORAL | Status: AC
  Administered 2020-11-26: 500 mg via ORAL

## 2020-11-26 NOTE — Progress Notes (Signed)
Urological Symptom Review ° °Patient is experiencing the following symptoms: °Frequent urination °Hard to postpone urination °Get up at night to urinate °Stream starts and stops °Trouble starting stream °Have to strain to urinate °Weak stream ° ° °Review of Systems ° °Gastrointestinal (upper)  : °Negative for upper GI symptoms ° °Gastrointestinal (lower) : °Negative for lower GI symptoms ° °Constitutional : °Negative for symptoms ° °Skin: °Negative for skin symptoms ° °Eyes: °Negative for eye symptoms ° °Ear/Nose/Throat : °Negative for Ear/Nose/Throat symptoms ° °Hematologic/Lymphatic: °Negative for Hematologic/Lymphatic symptoms ° °Cardiovascular : °Negative for cardiovascular symptoms ° °Respiratory : °Negative for respiratory symptoms ° °Endocrine: °Negative for endocrine symptoms ° °Musculoskeletal: °Negative for musculoskeletal symptoms ° °Neurological: °Negative for neurological symptoms ° °Psychologic: °Negative for psychiatric symptoms ° °

## 2020-12-02 ENCOUNTER — Telehealth: Payer: Self-pay

## 2020-12-02 NOTE — Telephone Encounter (Signed)
Pt left a Voice Message:  Needing to make a TUB appt.  Please advise.  Call back:  [phone]  Thanks, Rosey Bath

## 2020-12-03 NOTE — Telephone Encounter (Signed)
Patient wishes to proceed with TURP.

## 2020-12-20 ENCOUNTER — Telehealth: Payer: Self-pay

## 2020-12-20 NOTE — Telephone Encounter (Signed)
Patient call office this am to inquire about surgery.  Patient informed Dr. Retta Diones surgery scheduler, Monica Martinez, will reach out to him to have surgery scheduled. Patient voiced understanding.  Received email today from [facility] asking for patient OV to proceed with scheduling surgery. Appropriate documents faxed to [facility].

## 2020-12-23 ENCOUNTER — Other Ambulatory Visit: Payer: Self-pay | Admitting: Urology

## 2021-01-16 ENCOUNTER — Other Ambulatory Visit: Payer: Self-pay

## 2021-01-16 ENCOUNTER — Encounter (HOSPITAL_BASED_OUTPATIENT_CLINIC_OR_DEPARTMENT_OTHER): Payer: Self-pay | Admitting: Urology

## 2021-01-16 NOTE — Progress Notes (Signed)
Spoke w/ via phone for pre-op interview---pt  Lab needs dos----   EKG and I stat           Lab results------n/a COVID test -----patient states asymptomatic no test needed Arrive at -------1045 NPO after MN NO Solid Food.  Clear liquids from MN until--- Med rec completed Medications to take morning of surgery -----Hydralazine, carvedilol, alloprunol, finasteride and tamsulosin Diabetic medication -----n/a Patient instructed no nail polish to be worn day of surgery Patient instructed to bring photo id and insurance card day of surgery Patient aware to have Driver (ride ) / caregiver  [name]  for 24 hours after surgery  Patient Special Instructions -----Given overnight instructions Pre-Op special Istructions -----n/a Patient verbalized understanding of instructions that were given at this phone interview. Patient denies shortness of breath, chest pain, fever, cough at this phone interview.

## 2021-01-22 NOTE — H&P (Signed)
H&P  Chief Complaint: Large prostate  History of Present Illness: 61 yo male presents for TURP for mgmt of BPH with obstruction  Past Medical History:  Diagnosis Date   Asthma    as a child only   Gout    History of kidney stones    Hypertension     Past Surgical History:  Procedure Laterality Date   ANAL FISSURE REPAIR  01/20/2000   COLONOSCOPY  10/31/2010   Dr.Jacobs 10/2020   LITHOTRIPSY  01/20/2003   kidney stones    Home Medications:  Allergies as of 01/22/2021       Reactions   Bactrim [sulfamethoxazole-trimethoprim] Nausea Only   Benazepril Hcl Swelling   angioedema   Norvasc [amlodipine] Other (See Comments)   edema        Medication List      Notice   Cannot display discharge medications because the patient has not yet been admitted.     Allergies:  Allergies  Allergen Reactions   Bactrim [Sulfamethoxazole-Trimethoprim] Nausea Only   Benazepril Hcl Swelling    angioedema   Norvasc [Amlodipine] Other (See Comments)    edema    Family History  Problem Relation Age of Onset   Multiple myeloma Mother    ALS Sister    Colon cancer Neg Hx    Stomach cancer Neg Hx    Rectal cancer Neg Hx    Esophageal cancer Neg Hx    Colon polyps Neg Hx     Social History:  reports that he has never smoked. He has never used smokeless tobacco. He reports that he does not drink alcohol and does not use drugs.  ROS: A complete review of systems was performed.  All systems are negative except for pertinent findings as noted.  Physical Exam:  Vital signs in last 24 hours: Ht 6' (1.829 m)    Wt 100.7 kg    BMI 30.11 kg/m  Constitutional:  Alert and oriented, No acute distress Cardiovascular: Regular rate  Respiratory: Normal respiratory effort GI: Abdomen is soft, nontender, nondistended, no abdominal masses. No CVAT.  Genitourinary: Normal male phallus, testes are descended bilaterally and non-tender and without masses, scrotum is normal in appearance  without lesions or masses, perineum is normal on inspection. Lymphatic: No lymphadenopathy Neurologic: Grossly intact, no focal deficits Psychiatric: Normal mood and affect  I have reviewed prior pt notes  I have reviewed prior PSA results    Impression/Assessment:  BPH with obstruction  Plan:  TURP

## 2021-01-23 ENCOUNTER — Observation Stay (HOSPITAL_BASED_OUTPATIENT_CLINIC_OR_DEPARTMENT_OTHER)
Admission: RE | Admit: 2021-01-23 | Discharge: 2021-01-24 | Disposition: A | Discharge status: Home / Self Care | Payer: Commercial Managed Care - PPO | Attending: Urology | Admitting: Urology

## 2021-01-23 ENCOUNTER — Encounter (HOSPITAL_BASED_OUTPATIENT_CLINIC_OR_DEPARTMENT_OTHER)
Admission: RE | Disposition: A | Discharge status: Home / Self Care | Payer: Self-pay | Source: Home / Self Care | Attending: Urology

## 2021-01-23 ENCOUNTER — Ambulatory Visit (HOSPITAL_BASED_OUTPATIENT_CLINIC_OR_DEPARTMENT_OTHER): Payer: Commercial Managed Care - PPO | Admitting: Anesthesiology

## 2021-01-23 ENCOUNTER — Other Ambulatory Visit: Payer: Self-pay

## 2021-01-23 ENCOUNTER — Encounter (HOSPITAL_BASED_OUTPATIENT_CLINIC_OR_DEPARTMENT_OTHER): Payer: Self-pay | Admitting: Urology

## 2021-01-23 DIAGNOSIS — J45909 Unspecified asthma, uncomplicated: Secondary | ICD-10-CM | POA: Insufficient documentation

## 2021-01-23 DIAGNOSIS — I10 Essential (primary) hypertension: Secondary | ICD-10-CM | POA: Insufficient documentation

## 2021-01-23 DIAGNOSIS — N138 Other obstructive and reflux uropathy: Secondary | ICD-10-CM | POA: Diagnosis not present

## 2021-01-23 DIAGNOSIS — N401 Enlarged prostate with lower urinary tract symptoms: Principal | ICD-10-CM | POA: Diagnosis present

## 2021-01-23 HISTORY — DX: Personal history of urinary calculi: Z87.442

## 2021-01-23 HISTORY — PX: TRANSURETHRAL RESECTION OF PROSTATE: SHX73

## 2021-01-23 LAB — POCT I-STAT, CHEM 8
BUN: 18 mg/dL (ref 8–23)
Calcium, Ion: 1.26 mmol/L (ref 1.15–1.40)
Chloride: 106 mmol/L (ref 98–111)
Creatinine, Ser: 1.1 mg/dL (ref 0.61–1.24)
Glucose, Bld: 83 mg/dL (ref 70–99)
HCT: 43 % (ref 39.0–52.0)
Hemoglobin: 14.6 g/dL (ref 13.0–17.0)
Potassium: 4.2 mmol/L (ref 3.5–5.1)
Sodium: 144 mmol/L (ref 135–145)
TCO2: 25 mmol/L (ref 22–32)

## 2021-01-23 SURGERY — TURP (TRANSURETHRAL RESECTION OF PROSTATE)
Anesthesia: General | Site: Prostate

## 2021-01-23 MED ORDER — HYDRALAZINE HCL 20 MG/ML IJ SOLN
10.0000 mg | INTRAMUSCULAR | Status: DC | PRN
  Administered 2021-01-23: 10 mg via INTRAVENOUS

## 2021-01-23 MED ORDER — ONDANSETRON HCL 4 MG/2ML IJ SOLN
INTRAMUSCULAR | Status: AC
  Filled 2021-01-23: qty 2

## 2021-01-23 MED ORDER — PROPOFOL 10 MG/ML IV BOLUS
INTRAVENOUS | Status: AC
  Filled 2021-01-23: qty 20

## 2021-01-23 MED ORDER — ACETAMINOPHEN 500 MG PO TABS
ORAL_TABLET | ORAL | Status: AC
  Filled 2021-01-23: qty 2

## 2021-01-23 MED ORDER — LIDOCAINE 2% (20 MG/ML) 5 ML SYRINGE
INTRAMUSCULAR | Status: DC | PRN
  Administered 2021-01-23: 100 mg via INTRAVENOUS

## 2021-01-23 MED ORDER — ONDANSETRON HCL 4 MG/2ML IJ SOLN
INTRAMUSCULAR | Status: DC | PRN
  Administered 2021-01-23: 4 mg via INTRAVENOUS

## 2021-01-23 MED ORDER — DEXAMETHASONE SODIUM PHOSPHATE 10 MG/ML IJ SOLN
INTRAMUSCULAR | Status: AC
  Filled 2021-01-23: qty 1

## 2021-01-23 MED ORDER — HYDRALAZINE HCL 20 MG/ML IJ SOLN
INTRAMUSCULAR | Status: AC
  Filled 2021-01-23: qty 1

## 2021-01-23 MED ORDER — SENNA 8.6 MG PO TABS
ORAL_TABLET | ORAL | Status: AC
  Filled 2021-01-23: qty 1

## 2021-01-23 MED ORDER — MIDAZOLAM HCL 2 MG/2ML IJ SOLN
INTRAMUSCULAR | Status: AC
  Filled 2021-01-23: qty 2

## 2021-01-23 MED ORDER — CARVEDILOL 25 MG PO TABS
25.0000 mg | ORAL_TABLET | Freq: Two times a day (BID) | ORAL | Status: DC
  Administered 2021-01-23 – 2021-01-24 (×2): 25 mg via ORAL
  Filled 2021-01-23 (×3): qty 1

## 2021-01-23 MED ORDER — DROPERIDOL 2.5 MG/ML IJ SOLN: 0.6250 mg | Freq: Once | INTRAMUSCULAR | Status: DC | PRN

## 2021-01-23 MED ORDER — ACETAMINOPHEN 500 MG PO TABS
1000.0000 mg | ORAL_TABLET | Freq: Once | ORAL | Status: AC
  Administered 2021-01-23: 1000 mg via ORAL

## 2021-01-23 MED ORDER — HYDROCODONE-ACETAMINOPHEN 5-325 MG PO TABS
ORAL_TABLET | ORAL | Status: AC
  Filled 2021-01-23: qty 1

## 2021-01-23 MED ORDER — PHENYLEPHRINE 40 MCG/ML (10ML) SYRINGE FOR IV PUSH (FOR BLOOD PRESSURE SUPPORT)
PREFILLED_SYRINGE | INTRAVENOUS | Status: DC | PRN
  Administered 2021-01-23: 40 ug via INTRAVENOUS

## 2021-01-23 MED ORDER — SODIUM CHLORIDE 0.9 % IR SOLN
3000.0000 mL | Status: DC
  Administered 2021-01-23 (×3): 3000 mL

## 2021-01-23 MED ORDER — ALLOPURINOL 300 MG PO TABS
300.0000 mg | ORAL_TABLET | Freq: Every day | ORAL | Status: DC
Start: 2021-01-23 — End: 2021-01-24
  Filled 2021-01-23: qty 1

## 2021-01-23 MED ORDER — DEXAMETHASONE SODIUM PHOSPHATE 10 MG/ML IJ SOLN
INTRAMUSCULAR | Status: DC | PRN
  Administered 2021-01-23: 5 mg via INTRAVENOUS

## 2021-01-23 MED ORDER — FENTANYL CITRATE (PF) 100 MCG/2ML IJ SOLN
INTRAMUSCULAR | Status: DC | PRN
  Administered 2021-01-23 (×2): 50 ug via INTRAVENOUS

## 2021-01-23 MED ORDER — SODIUM CHLORIDE 0.9 % IR SOLN
Status: DC | PRN
  Administered 2021-01-23 (×2): 3000 mL via INTRAVESICAL

## 2021-01-23 MED ORDER — LACTATED RINGERS IV SOLN: INTRAVENOUS | Status: DC

## 2021-01-23 MED ORDER — CEFAZOLIN SODIUM-DEXTROSE 2-4 GM/100ML-% IV SOLN
INTRAVENOUS | Status: AC
  Filled 2021-01-23: qty 100

## 2021-01-23 MED ORDER — SENNA 8.6 MG PO TABS
1.0000 | ORAL_TABLET | Freq: Two times a day (BID) | ORAL | Status: DC
  Administered 2021-01-23: 8.6 mg via ORAL

## 2021-01-23 MED ORDER — ONDANSETRON HCL 4 MG/2ML IJ SOLN: 4.0000 mg | Freq: Once | INTRAMUSCULAR | Status: DC | PRN

## 2021-01-23 MED ORDER — HYDRALAZINE HCL 25 MG PO TABS
25.0000 mg | ORAL_TABLET | Freq: Two times a day (BID) | ORAL | Status: DC
  Administered 2021-01-23 – 2021-01-24 (×2): 25 mg via ORAL
  Filled 2021-01-23 (×3): qty 1

## 2021-01-23 MED ORDER — ZOLPIDEM TARTRATE 5 MG PO TABS: 5.0000 mg | ORAL_TABLET | Freq: Every evening | ORAL | Status: DC | PRN

## 2021-01-23 MED ORDER — FENTANYL CITRATE (PF) 100 MCG/2ML IJ SOLN
INTRAMUSCULAR | Status: AC
  Filled 2021-01-23: qty 2

## 2021-01-23 MED ORDER — MIDAZOLAM HCL 5 MG/5ML IJ SOLN
INTRAMUSCULAR | Status: DC | PRN
Start: 2021-01-23 — End: 2021-01-23
  Administered 2021-01-23: 2 mg via INTRAVENOUS

## 2021-01-23 MED ORDER — CARVEDILOL 25 MG PO TABS: 25.0000 mg | ORAL_TABLET | Freq: Two times a day (BID) | ORAL | Status: DC

## 2021-01-23 MED ORDER — BELLADONNA ALKALOIDS-OPIUM 16.2-60 MG RE SUPP: 1.0000 | Freq: Four times a day (QID) | RECTAL | Status: DC | PRN

## 2021-01-23 MED ORDER — PHENYLEPHRINE 40 MCG/ML (10ML) SYRINGE FOR IV PUSH (FOR BLOOD PRESSURE SUPPORT)
PREFILLED_SYRINGE | INTRAVENOUS | Status: AC
  Filled 2021-01-23: qty 10

## 2021-01-23 MED ORDER — LIDOCAINE 2% (20 MG/ML) 5 ML SYRINGE
INTRAMUSCULAR | Status: AC
  Filled 2021-01-23: qty 5

## 2021-01-23 MED ORDER — OXYCODONE HCL 5 MG/5ML PO SOLN: 5.0000 mg | Freq: Once | ORAL | Status: DC | PRN

## 2021-01-23 MED ORDER — OXYCODONE HCL 5 MG PO TABS: 5.0000 mg | ORAL_TABLET | Freq: Once | ORAL | Status: DC | PRN

## 2021-01-23 MED ORDER — HYDRALAZINE HCL 20 MG/ML IJ SOLN
10.0000 mg | Freq: Once | INTRAMUSCULAR | Status: AC
  Administered 2021-01-23: 10 mg via INTRAVENOUS

## 2021-01-23 MED ORDER — ACETAMINOPHEN 325 MG PO TABS: 650.0000 mg | ORAL_TABLET | ORAL | Status: DC | PRN

## 2021-01-23 MED ORDER — HYDROCODONE-ACETAMINOPHEN 5-325 MG PO TABS
1.0000 | ORAL_TABLET | ORAL | Status: DC | PRN
  Administered 2021-01-23 – 2021-01-24 (×3): 1 via ORAL
  Administered 2021-01-24 (×2): 2 via ORAL

## 2021-01-23 MED ORDER — CEFAZOLIN SODIUM-DEXTROSE 2-4 GM/100ML-% IV SOLN
2.0000 g | INTRAVENOUS | Status: AC
  Administered 2021-01-23: 2 g via INTRAVENOUS

## 2021-01-23 MED ORDER — PROPOFOL 10 MG/ML IV BOLUS
INTRAVENOUS | Status: DC | PRN
  Administered 2021-01-23: 200 mg via INTRAVENOUS

## 2021-01-23 MED ORDER — SODIUM CHLORIDE 0.45 % IV SOLN: INTRAVENOUS | Status: DC

## 2021-01-23 MED ORDER — FENTANYL CITRATE (PF) 100 MCG/2ML IJ SOLN
25.0000 ug | INTRAMUSCULAR | Status: DC | PRN
  Administered 2021-01-23: 25 ug via INTRAVENOUS

## 2021-01-23 SURGICAL SUPPLY — 30 items
BAG DRAIN URO-CYSTO SKYTR STRL (DRAIN) ×3 IMPLANT
BAG DRN RND TRDRP ANRFLXCHMBR (UROLOGICAL SUPPLIES) ×1
BAG DRN UROCATH (DRAIN) ×1
BAG URINE DRAIN 2000ML AR STRL (UROLOGICAL SUPPLIES) ×3 IMPLANT
BAG URINE LEG 500ML (DRAIN) IMPLANT
CATH FOLEY 2WAY SLVR  5CC 20FR (CATHETERS)
CATH FOLEY 2WAY SLVR  5CC 22FR (CATHETERS)
CATH FOLEY 2WAY SLVR 30CC 22FR (CATHETERS) IMPLANT
CATH FOLEY 2WAY SLVR 5CC 20FR (CATHETERS) IMPLANT
CATH FOLEY 2WAY SLVR 5CC 22FR (CATHETERS) IMPLANT
CATH FOLEY 3WAY 30CC 22FR (CATHETERS) ×1 IMPLANT
CATH HEMA 3WAY 30CC 24FR COUDE (CATHETERS) IMPLANT
CATH HEMA 3WAY 30CC 24FR RND (CATHETERS) IMPLANT
CLOTH BEACON ORANGE TIMEOUT ST (SAFETY) ×3 IMPLANT
ELECT REM PT RETURN 9FT ADLT (ELECTROSURGICAL) ×2
ELECTRODE REM PT RTRN 9FT ADLT (ELECTROSURGICAL) ×2 IMPLANT
EVACUATOR MICROVAS BLADDER (UROLOGICAL SUPPLIES) ×1 IMPLANT
GLOVE SURG ENC MOIS LTX SZ8 (GLOVE) ×3 IMPLANT
GOWN STRL REUS W/TWL XL LVL3 (GOWN DISPOSABLE) ×3 IMPLANT
HOLDER FOLEY CATH W/STRAP (MISCELLANEOUS) ×1 IMPLANT
KIT TURNOVER CYSTO (KITS) ×3 IMPLANT
LOOP CUT BIPOLAR 24F LRG (ELECTROSURGICAL) ×3 IMPLANT
MANIFOLD NEPTUNE II (INSTRUMENTS) ×3 IMPLANT
PACK CYSTO (CUSTOM PROCEDURE TRAY) ×3 IMPLANT
PLUG CATH AND CAP STER (CATHETERS) IMPLANT
SYR 30ML LL (SYRINGE) ×1 IMPLANT
SYR TOOMEY IRRIG 70ML (MISCELLANEOUS)
SYRINGE TOOMEY IRRIG 70ML (MISCELLANEOUS) IMPLANT
TUBE CONNECTING 12X1/4 (SUCTIONS) ×3 IMPLANT
TUBING UROLOGY SET (TUBING) ×1 IMPLANT

## 2021-01-23 NOTE — Discharge Instructions (Signed)
Transurethral Resection of the Prostate ° °Care After ° °Refer to this sheet in the next few weeks. These discharge instructions provide you with general information on caring for yourself after you leave the hospital. Your caregiver may also give you specific instructions. Your treatment has been planned according to the most current medical practices available, but unavoidable complications sometimes occur. If you have any problems or questions after discharge, please call your caregiver. ° °HOME CARE INSTRUCTIONS  ° °Medications °· You may receive medicine for pain management. As your level of discomfort decreases, adjustments in your pain medicines may be made.  °· Take all medicines as directed.  °· You may be given a medicine (antibiotic) to kill germs following surgery. Finish all medicines. Let your caregiver know if you have any side effects or problems from the medicine.  °· If you are on aspirin, it would be best not to restart the aspirin until the blood in the urine clears °Hygiene °· You can take a shower after surgery.  °· You should not take a bath while you still have the urethral catheter. °Activity °· You will be encouraged to get out of bed as much as possible and increase your activity level as tolerated.  °· Spend the first week in and around your home. For 3 weeks, avoid the following:  °· Straining.  °· Running.  °· Strenuous work.  °· Walks longer than a few blocks.  °· Riding for extended periods.  °· Sexual relations.  °· Do not lift heavy objects (more than 20 pounds) for at least 1 month. When lifting, use your arms instead of your abdominal muscles.  °· You will be encouraged to walk as tolerated. Do not exert yourself. Increase your activity level slowly. Remember that it is important to keep moving after an operation of any type. This cuts down on the possibility of developing blood clots.  °· Your caregiver will tell you when you can resume driving and light housework. Discuss this  at your first office visit after discharge. °Diet °· No special diet is ordered after a TURP. However, if you are on a special diet for another medical problem, it should be continued.  °· Normal fluid intake is usually recommended.  °· Avoid alcohol and caffeinated drinks for 2 weeks. They irritate the bladder. Decaffeinated drinks are okay.  °· Avoid spicy foods.  °Bladder Function °· For the first 10 days, empty the bladder whenever you feel a definite desire. Do not try to hold the urine for long periods of time.  °· Urinating once or twice a night even after you are healed is not uncommon.  °· You may see some recurrence of blood in the urine after discharge from the hospital. This usually happens within 2 weeks after the procedure.If this occurs, force fluids again as you did in the hospital and reduce your activity.  °Bowel Function °· You may experience some constipation after surgery. This can be minimized by increasing fluids and fiber in your diet. Drink enough water and fluids to keep your urine clear or pale yellow.  °· A stool softener may be prescribed for use at home. Do not strain to move your bowels.  °· If you are requiring increased pain medicine, it is important that you take stool softeners to prevent constipation. This will help to promote proper healing by reducing the need to strain to move your bowels.  °Sexual Activity °· Semen movement in the opposite direction and into the bladder (  retrograde ejaculation) may occur. Since the semen passes into the bladder, cloudy urine can occur the first time you urinate after intercourse. Or, you may not have an ejaculation during erection. Ask your caregiver when you can resume sexual activity. Retrograde ejaculation and reduced semen discharge should not reduce one's pleasure of intercourse.  °Postoperative Visit °· Arrange the date and time of your after surgery visit with your caregiver.  °Return to Work °· After your recovery is complete, you will  be able to return to work and resume all activities. Your caregiver will inform you when you can return to work.  °Foley Catheter Care °A soft, flexible tube (Foley catheter) may have been placed in your bladder to drain urine and fluid. Follow these instructions: °Taking Care of the Catheter °· Keep the area where the catheter leaves your body clean.  °· Attach the catheter to the leg so there is no tension on the catheter.  °· Keep the drainage bag below the level of the bladder, but keep it OFF the floor.  °· Do not take long soaking baths. Your caregiver will give instructions about showering.  °· Wash your hands before touching ANYTHING related to the catheter or bag.  °· Using mild soap and warm water on a washcloth:  °· Clean the area closest to the catheter insertion site using a circular motion around the catheter.  °· Clean the catheter itself by wiping AWAY from the insertion site for several inches down the tube.  °· NEVER wipe upward as this could sweep bacteria up into the urethra (tube in your body that normally drains the bladder) and cause infection.  °· Place a small amount of sterile lubricant at the tip of the penis where the catheter is entering.  °Taking Care of the Drainage Bags °· Two drainage bags may be taken home: a large overnight drainage bag, and a smaller leg bag which fits underneath clothing.  °· It is okay to wear the overnight bag at any time, but NEVER wear the smaller leg bag at night.  °· Keep the drainage bag well below the level of your bladder. This prevents backflow of urine into the bladder and allows the urine to drain freely.  °· Anchor the tubing to your leg to prevent pulling or tension on the catheter. Use tape or a leg strap provided by the hospital.  °· Empty the drainage bag when it is 1/2 to 3/4 full. Wash your hands before and after touching the bag.  °· Periodically check the tubing for kinks to make sure there is no pressure on the tubing which could restrict  the flow of urine.  °Changing the Drainage Bags °· Cleanse both ends of the clean bag with alcohol before changing.  °· Pinch off the rubber catheter to avoid urine spillage during the disconnection.  °· Disconnect the dirty bag and connect the clean one.  °· Empty the dirty bag carefully to avoid a urine spill.  °· Attach the new bag to the leg with tape or a leg strap.  °Cleaning the Drainage Bags °· Whenever a drainage bag is disconnected, it must be cleaned quickly so it is ready for the next use.  °· Wash the bag in warm, soapy water.  °· Rinse the bag thoroughly with warm water.  °· Soak the bag for 30 minutes in a solution of white vinegar and water (1 cup vinegar to 1 quart warm water).  °· Rinse with warm water.  °SEEK MEDICAL   CARE IF:  °· You have chills or night sweats.  °· You are leaking around your catheter or have problems with your catheter. It is not uncommon to have sporadic leakage around your catheter as a result of bladder spasms. If the leakage stops, there is not much need for concern. If you are uncertain, call your caregiver.  °· You develop side effects that you think are coming from your medicines.  °SEEK IMMEDIATE MEDICAL CARE IF:  °· You are suddenly unable to urinate. Check to see if there are any kinks in the drainage tubing that may cause this. If you cannot find any kinks, call your caregiver immediately. This is an emergency.  °· You develop shortness of breath or chest pains.  °· Bleeding persists or clots develop in your urine.  °· You have a fever.  °· You develop pain in your back or over your lower belly (abdomen).  °· You develop pain or swelling in your legs.  °· Any problems you are having get worse rather than better.  °MAKE SURE YOU:  °· Understand these instructions.  °· Will watch your condition.  °· Will get help right away if you are not doing well or get worse.  °Document Released: 01/05/2005 Document Revised: 09/17/2010 Document Reviewed: 08/29/2008 °[employer]®  Patient Information ©2012 [employer].Transurethral Resection of the Prostate °Care After °Refer to this sheet in the next few weeks. These discharge instructions provide you with general information on caring for yourself after you leave the hospital. Your caregiver may also give you specific instructions. Your treatment has been planned according to the most current medical practices available, but unavoidable complications sometimes occur. If you have any problems or questions after discharge, please call your caregiver. °

## 2021-01-23 NOTE — Anesthesia Preprocedure Evaluation (Addendum)
Anesthesia Evaluation  Patient identified by MRN, date of birth, ID band Patient awake    Reviewed: Allergy & Precautions, NPO status , Patient's Chart, lab work & pertinent test results  Airway Mallampati: II  TM Distance: >3 FB Neck ROM: Full    Dental no notable dental hx.    Pulmonary asthma ,    Pulmonary exam normal breath sounds clear to auscultation       Cardiovascular Exercise Tolerance: Good hypertension, Pt. on medications and Pt. on home beta blockers Normal cardiovascular exam Rhythm:Regular Rate:Normal     Neuro/Psych negative neurological ROS  negative psych ROS   GI/Hepatic negative GI ROS, Neg liver ROS,   Endo/Other  negative endocrine ROS  Renal/GU Renal disease   BPH with obstruction    Musculoskeletal negative musculoskeletal ROS (+)   Abdominal   Peds negative pediatric ROS (+)  Hematology negative hematology ROS (+)   Anesthesia Other Findings   Reproductive/Obstetrics negative OB ROS                            Anesthesia Physical Anesthesia Plan  ASA: 3  Anesthesia Plan: General   Post-op Pain Management:    Induction:   PONV Risk Score and Plan: 2 and Treatment may vary due to age or medical condition, Ondansetron, Dexamethasone and Midazolam  Airway Management Planned: LMA  Additional Equipment: None  Intra-op Plan:   Post-operative Plan: Extubation in OR  Informed Consent: I have reviewed the patients History and Physical, chart, labs and discussed the procedure including the risks, benefits and alternatives for the proposed anesthesia with the patient or authorized representative who has indicated his/her understanding and acceptance.       Plan Discussed with: CRNA and Anesthesiologist  Anesthesia Plan Comments: (BP elevated despite taking his po meds this AM. Patient is asymptomatic, states he is nervous. Will give hydralazine 10mg  IV x  1. , MD  )      Anesthesia Quick Evaluation

## 2021-01-23 NOTE — Anesthesia Postprocedure Evaluation (Signed)
Anesthesia Post Note  Patient: Timothy Hahn  Procedure(s) Performed: TRANSURETHRAL RESECTION OF THE PROSTATE (TURP) (Prostate)     Patient location during evaluation: PACU Anesthesia Type: General Level of consciousness: awake Pain management: pain level controlled Vital Signs Assessment: post-procedure vital signs reviewed and stable Respiratory status: spontaneous breathing and respiratory function stable Cardiovascular status: stable Postop Assessment: no apparent nausea or vomiting Anesthetic complications: no   No notable events documented.  Last Vitals:  Vitals:   01/23/21 1315 01/23/21 1330  BP: (!) 163/105 (!) 162/111  Pulse: 66 64  Resp: 11 16  Temp:    SpO2: 97% 97%    Last Pain:  Vitals:   01/23/21 1313  TempSrc:   PainSc: 0-No pain                 Mellody Dance

## 2021-01-23 NOTE — Interval H&P Note (Signed)
History and Physical Interval Note:  01/23/2021 11:41 AM  Timothy Hahn  has presented today for surgery, with the diagnosis of BENIGN PROSTATIC HYPERPLASIA.  The various methods of treatment have been discussed with the patient and family. After consideration of risks, benefits and other options for treatment, the patient has consented to  Procedure(s): TRANSURETHRAL RESECTION OF THE PROSTATE (TURP) (N/A) as a surgical intervention.  The patient's history has been reviewed, patient examined, no change in status, stable for surgery.  I have reviewed the patient's chart and labs.  Questions were answered to the patient's satisfaction.     Bertram Millard Ceazia Harb

## 2021-01-23 NOTE — Op Note (Signed)
Preoperative diagnosis: Bladder outlet obstruction secondary to BPH  Postoperative diagnosis:  Bladder outlet obstruction secondary to BPH  Procedure:  Cystoscopy Transurethral resection of the prostate  Surgeon: Bertram Millard. Anderson Coppock, M.D.  Anesthesia: General  Complications: None  Drain: Foley catheter  EBL: Minimal  Specimens: Prostate chips  Disposition of specimens: Pathology  Indication: Timothy Hahn is a patient with bladder outlet obstruction secondary to benign prostatic hyperplasia. After reviewing the management options for treatment, he elected to proceed with the above surgical procedure(s). We have discussed the potential benefits and risks of the procedure, side effects of the proposed treatment, the likelihood of the patient achieving the goals of the procedure, and any potential problems that might occur during the procedure or recuperation. Informed consent has been obtained.  Description of procedure:  The patient was identified in the holding area. He received preoperative antibiotics. He was then taken to the operating room. General anesthetic was administered.  The patient was then placed in the dorsal lithotomy position, prepped and draped in the usual sterile fashion. Timeout was then performed.  A resectoscope sheath was placed using the visual obturator.   The bladder was then systematically examined in its entirety. There was no evidence of  tumors, stones, or other mucosal pathology.The resectoscope, loop and telescope were then placed.  The ureteral orifices were identified so as to be avoided during the procedure.  The prostate adenoma was then resected utilizing loop cautery resection with the bipolar cutting loop.  The prostate adenoma from the bladder neck back to the verumontanum was resected beginning at the six o'clock position and then extended to include the right and left lobes of the prostate and anterior prostate, respectively. Care  was taken not to resect distal to the verumontanum.  Hemostasis was then achieved with the cautery and the bladder was emptied and reinspected with no significant bleeding noted at the end of the procedure.  Resected chips were irrigated from the bladder with the evacuator and sent to pathology.  A 22 Fr 3 way foley catheter was then placed into the bladder and placed on continuous bladder irrigation.  The patient appeared to tolerate the procedure well and without complications. The patient was able to be awakened and transferred to the recovery unit in satisfactory condition. He tolerated the procedure well.

## 2021-01-23 NOTE — Transfer of Care (Signed)
Immediate Anesthesia Transfer of Care Note  Patient: Timothy Hahn  Procedure(s) Performed: TRANSURETHRAL RESECTION OF THE PROSTATE (TURP) (Prostate)  Patient Location: PACU  Anesthesia Type:General  Level of Consciousness: awake, alert , oriented and patient cooperative  Airway & Oxygen Therapy: Patient Spontanous Breathing  Post-op Assessment: Report given to RN and Post -op Vital signs reviewed and stable  Post vital signs: Reviewed and stable  Last Vitals:  Vitals Value Taken Time  BP 158/111 01/23/21 1313  Temp    Pulse 66 01/23/21 1314  Resp 10 01/23/21 1314  SpO2 95 % 01/23/21 1314  Vitals shown include unvalidated device data.  Last Pain:  Vitals:   01/23/21 1027  TempSrc: Oral  PainSc: 0-No pain      Patients Stated Pain Goal: 4 (01/23/21 1027)  Complications: No notable events documented.

## 2021-01-23 NOTE — Anesthesia Procedure Notes (Signed)
Procedure Name: LMA Insertion Date/Time: 01/23/2021 12:17 PM Performed by: Bishop Limbo, CRNA Pre-anesthesia Checklist: Patient identified, Emergency Drugs available, Suction available and Patient being monitored Patient Re-evaluated:Patient Re-evaluated prior to induction Oxygen Delivery Method: Circle System Utilized Preoxygenation: Pre-oxygenation with 100% oxygen Induction Type: IV induction Ventilation: Mask ventilation without difficulty LMA: LMA inserted LMA Size: 5.0 Number of attempts: 1 Airway Equipment and Method: Bite block Placement Confirmation: positive ETCO2 Tube secured with: Tape Dental Injury: Teeth and Oropharynx as per pre-operative assessment

## 2021-01-24 ENCOUNTER — Other Ambulatory Visit (HOSPITAL_COMMUNITY): Payer: Self-pay

## 2021-01-24 ENCOUNTER — Encounter (HOSPITAL_BASED_OUTPATIENT_CLINIC_OR_DEPARTMENT_OTHER): Payer: Self-pay | Admitting: Urology

## 2021-01-24 DIAGNOSIS — N401 Enlarged prostate with lower urinary tract symptoms: Secondary | ICD-10-CM | POA: Diagnosis not present

## 2021-01-24 LAB — SURGICAL PATHOLOGY

## 2021-01-24 MED ORDER — OXYBUTYNIN CHLORIDE 5 MG PO TABS
5.0000 mg | ORAL_TABLET | Freq: Three times a day (TID) | ORAL | 1 refills | Status: AC | PRN
  Filled 2021-01-24: qty 15, 5d supply, fill #0

## 2021-01-24 MED ORDER — HYDROCODONE-ACETAMINOPHEN 5-325 MG PO TABS
ORAL_TABLET | ORAL | Status: AC
  Filled 2021-01-24: qty 2

## 2021-01-24 MED ORDER — CEPHALEXIN 500 MG PO CAPS
500.0000 mg | ORAL_CAPSULE | Freq: Two times a day (BID) | ORAL | 0 refills | Status: AC
  Filled 2021-01-24: qty 6, 3d supply, fill #0

## 2021-01-24 MED ORDER — LABETALOL HCL 5 MG/ML IV SOLN
INTRAVENOUS | Status: AC
  Filled 2021-01-24: qty 4

## 2021-01-24 MED ORDER — HYDROCODONE-ACETAMINOPHEN 5-325 MG PO TABS
ORAL_TABLET | ORAL | Status: AC
  Filled 2021-01-24: qty 1

## 2021-01-24 MED ORDER — LABETALOL HCL 5 MG/ML IV SOLN
10.0000 mg | Freq: Three times a day (TID) | INTRAVENOUS | Status: DC | PRN
  Administered 2021-01-24: 10 mg via INTRAVENOUS

## 2021-01-24 MED ORDER — HYDRALAZINE HCL 20 MG/ML IJ SOLN
INTRAMUSCULAR | Status: AC
  Filled 2021-01-24: qty 1

## 2021-01-24 MED ORDER — HYDRALAZINE HCL 20 MG/ML IJ SOLN
10.0000 mg | Freq: Once | INTRAMUSCULAR | Status: AC
  Administered 2021-01-24: 10 mg via INTRAVENOUS

## 2021-01-24 NOTE — Progress Notes (Signed)
Patient was able to void 250 cc but urine was very bloody and bottom of urinal was full of clots. Dr Retta Diones was notified and order received to place 18 fr coude foley cath, irrigate cath, and give patient foley teaching. MD states that the office will call patient to determine when foley cath will be taken out. Catheter was placed without any difficulty and RN was able to push irrigant in but not pull back. Irrigant did drain back when reconnected to foley bag. Education done with patient and patient's wife. Patient stable at time of discharge.

## 2021-01-24 NOTE — Progress Notes (Signed)
Patient's blood pressure noted to be high, 187/110, at bedtime. Patient medicated with scheduled dose of hydralazine and given time for it to take affect. Patient's blood pressure was even higher after dose given at 199/117. Patient had no complaints of symptoms with the elevated blood pressure. Notified provider on call, Dr. Corky Sox, who gave order for a one time dose of Hydralazine 10 mg IV. Blood pressure taken one hour later and had decreased to 156/99 at 02:15 am. Patient still without complaints. Will continue to closely monitor blood pressure through the rest of the shift.

## 2021-01-27 ENCOUNTER — Encounter (HOSPITAL_BASED_OUTPATIENT_CLINIC_OR_DEPARTMENT_OTHER): Payer: Self-pay | Admitting: Urology

## 2021-01-28 ENCOUNTER — Other Ambulatory Visit: Payer: Self-pay

## 2021-01-28 ENCOUNTER — Telehealth: Payer: Self-pay

## 2021-01-28 ENCOUNTER — Ambulatory Visit (INDEPENDENT_AMBULATORY_CARE_PROVIDER_SITE_OTHER): Payer: Commercial Managed Care - PPO

## 2021-01-28 DIAGNOSIS — N400 Enlarged prostate without lower urinary tract symptoms: Secondary | ICD-10-CM

## 2021-01-28 NOTE — Telephone Encounter (Signed)
Patient called and made aware.

## 2021-01-28 NOTE — Progress Notes (Signed)
Fill and Pull Catheter Removal  Patient is present today for a catheter removal.  Patient was cleaned and prepped in a sterile fashion of sterile water/ saline was instilled into the bladder when the patient felt the urge to urinate. 20ml of water was then drained from the balloon.  A 18FR foley cath was removed from the bladder no complications were noted .  Patient as then given some time to void on their own.  Patient can void  on their own after some time.  Patient tolerated well.  Performed by: Marchelle Folks RN  Follow up/ Additional notes: keep post op appt with MD

## 2021-01-28 NOTE — Telephone Encounter (Signed)
-----   Message from [name], MD sent at 01/28/2021 12:15 PM EST ----- Please notify pt--all tissue from surgery was benign

## 2021-02-03 ENCOUNTER — Telehealth: Payer: Self-pay

## 2021-02-03 NOTE — Telephone Encounter (Signed)
Patient called and wanted to know if he could pick up a return to work note. He wanted to be released to return to work this Thursday January 19th. I wanted to make sure before completing the note and notifying patient is was ready. He is ok to be released back to work Thursday and will he have any restrictions?  Thank you

## 2021-02-04 ENCOUNTER — Encounter: Payer: Self-pay | Admitting: Urology

## 2021-02-04 NOTE — Discharge Summary (Signed)
Date of admission: 01/23/2021  Date of discharge: 02/04/2021  Admission diagnosis: BPH    Discharge diagnosis: BPH  Secondary diagnoses: None  History and Physical: For full details, please see admission history and physical. Briefly, Timothy Hahn is a 61 y.o. male with BPH.   Hospital Course:  The patient underwent transurethral resection of prostate on 1.5.2023. Continuous bladder irrigation was initiated post-operatively. He tolerated the procedure well and was transferred to the floor after receiving routine post-operative care. His diet was gradually advanced, and his pain was controlled with oral analgesics.  By POD1, his urine had cleared on slow drip CBI, so CBI was stopped. His hematuria remained mild with CBI stopped and after ambulation. CBI was disconnected, and the catheter was removed.  However, he did not void well and catheter was replaced.  He was sent home with catheter for eventual voiding trial in the office.  Laboratory values:  No results for input(s): HGB, HCT in the last 72 hours. No results for input(s): CREATININE in the last 72 hours. Physical Exam:  General: Alert and oriented CV: Regular rate Lungs: NWOB on RA Abdomen: Soft, nondistended, nontender GU: Foley in place draining clear urine without clots  Extremities: Warm and well-perfused   Disposition: Home  Discharge instruction: The patient was instructed to be ambulatory but told to refrain from heavy lifting, strenuous activity, or driving while on narcotics.   Discharge medications:  Allergies as of 01/24/2021       Reactions   Bolivia Nut (berthollefia [place]) Skin Test Anaphylaxis   Bactrim [sulfamethoxazole-trimethoprim] Nausea Only   Benazepril Hcl Swelling   angioedema   Norvasc [amlodipine] Other (See Comments)   edema        Medication List     STOP taking these medications    finasteride 5 MG tablet Commonly known as: PROSCAR   tamsulosin 0.4 MG Caps capsule Commonly  known as: FLOMAX       TAKE these medications    acetaminophen 500 MG tablet Commonly known as: TYLENOL Take 500 mg by mouth every 6 (six) hours as needed. Only as needed hasnt taken in awhile 01/16/2021   allopurinol 300 MG tablet Commonly known as: ZYLOPRIM Take 300 mg by mouth daily.   carvedilol 25 MG tablet Commonly known as: COREG Take 25 mg by mouth 2 (two) times daily. Notes to patient: Next dose is due tonight   hydrALAZINE 25 MG tablet Commonly known as: APRESOLINE Take 25 mg by mouth 2 (two) times daily. Notes to patient: Next dose is due tonight   oxybutynin 5 MG tablet Commonly known as: DITROPAN Take 1 tablet (5 mg total) by mouth every 8 (eight) hours as needed for up to 15 doses for bladder spasms.   VITAMIN D3 PO Take by mouth.       ASK your doctor about these medications    cephALEXin 500 MG capsule Commonly known as: Keflex Take 1 capsule (500 mg total) by mouth 2 (two) times daily for 6 doses. Ask about: Should I take this medication?          Followup:   Follow-up Information     [name], MD Follow up.   Specialty: Urology Why: Valentines Day! Contact information: [address] [place] [address] [phone]

## 2021-02-27 ENCOUNTER — Telehealth: Payer: Self-pay

## 2021-02-27 NOTE — Telephone Encounter (Signed)
Patient called and left a vm advising since his TURP surgery he has has had constipation and its been an ongoing issue. He wanted to know if someone would call him to offer some advice on same.

## 2021-02-27 NOTE — Telephone Encounter (Signed)
Discussed with Noreene Larsson, patient to take senokot-S along miralax to help with constipation  Left message for patient to return call to office to give advice.

## 2021-03-04 ENCOUNTER — Encounter: Payer: Self-pay | Admitting: Urology

## 2021-03-04 ENCOUNTER — Ambulatory Visit (INDEPENDENT_AMBULATORY_CARE_PROVIDER_SITE_OTHER): Payer: Commercial Managed Care - PPO | Admitting: Urology

## 2021-03-04 ENCOUNTER — Other Ambulatory Visit: Payer: Self-pay

## 2021-03-04 VITALS — BP 150/96 | HR 66 | Wt 218.0 lb

## 2021-03-04 DIAGNOSIS — N400 Enlarged prostate without lower urinary tract symptoms: Secondary | ICD-10-CM

## 2021-03-04 DIAGNOSIS — R339 Retention of urine, unspecified: Secondary | ICD-10-CM

## 2021-03-04 NOTE — Progress Notes (Signed)
History of Present Illness: Here for follow up TURP.  He underwent this procedure on 1.5.2023.  15 g of tissue resected.  All benign.  He did fail an initial voiding trial  No blood in urine or dysuria. IPSS 2  QoL score 0  Past Medical History:  Diagnosis Date   Asthma    as a child only   Gout    History of kidney stones    Hypertension     Past Surgical History:  Procedure Laterality Date   ANAL FISSURE REPAIR  01/20/2000   COLONOSCOPY  10/31/2010   Dr.Jacobs 10/2020   LITHOTRIPSY  01/20/2003   kidney stones   TRANSURETHRAL RESECTION OF PROSTATE N/A 01/23/2021   Procedure: TRANSURETHRAL RESECTION OF THE PROSTATE (TURP);  Surgeon: [name], MD;  Location: [facility];  Service: Urology;  Laterality: N/A;    Home Medications:  Allergies as of 03/04/2021       Reactions   Bolivia Nut (berthollefia [place]) Skin Test Anaphylaxis   Bactrim [sulfamethoxazole-trimethoprim] Nausea Only   Benazepril Hcl Swelling   angioedema   Norvasc [amlodipine] Other (See Comments)   edema        Medication List        Accurate as of March 04, 2021  8:15 AM. If you have any questions, ask your nurse or doctor.          acetaminophen 500 MG tablet Commonly known as: TYLENOL Take 500 mg by mouth every 6 (six) hours as needed. Only as needed hasnt taken in awhile 01/16/2021   allopurinol 300 MG tablet Commonly known as: ZYLOPRIM Take 300 mg by mouth daily.   carvedilol 25 MG tablet Commonly known as: COREG Take 25 mg by mouth 2 (two) times daily.   hydrALAZINE 25 MG tablet Commonly known as: APRESOLINE Take 25 mg by mouth 2 (two) times daily.   oxybutynin 5 MG tablet Commonly known as: DITROPAN Take 1 tablet (5 mg total) by mouth every 8 (eight) hours as needed for up to 15 doses for bladder spasms.   VITAMIN D3 PO Take by mouth.        Allergies:  Allergies  Allergen Reactions   Bolivia Nut Daisey Must) Skin Test Anaphylaxis    Bactrim [Sulfamethoxazole-Trimethoprim] Nausea Only   Benazepril Hcl Swelling    angioedema   Norvasc [Amlodipine] Other (See Comments)    edema    Family History  Problem Relation Age of Onset   Multiple myeloma Mother    ALS Sister    Colon cancer Neg Hx    Stomach cancer Neg Hx    Rectal cancer Neg Hx    Esophageal cancer Neg Hx    Colon polyps Neg Hx     Social History:  reports that he has never smoked. He has never used smokeless tobacco. He reports that he does not drink alcohol and does not use drugs.  ROS: A complete review of systems was performed.  All systems are negative except for pertinent findings as noted.  Physical Exam:  Vital signs in last 24 hours: There were no vitals taken for this visit. Constitutional:  Alert and oriented, No acute distress Cardiovascular: Regular rate  Respiratory: Normal respiratory effort GI: Abdomen is soft, nontender, nondistended, no abdominal masses. No CVAT.  Genitourinary: Normal male phallus, testes are descended bilaterally and non-tender and without masses, scrotum is normal in appearance without lesions or masses, perineum is normal on inspection. Lymphatic: No lymphadenopathy Neurologic: Grossly intact, no focal  deficits Psychiatric: Normal mood and affect  I have reviewed prior pt notes  I have reviewed notes from referring/previous physicians  I have reviewed urinalysis results  I have reviewed path results   Impression/Assessment:  BPH, status post TURP  Plan:  I will see him back in 6 months just for recheck

## 2021-03-05 LAB — MICROSCOPIC EXAMINATION: Renal Epithel, UA: NONE SEEN /hpf

## 2021-03-05 LAB — URINALYSIS, ROUTINE W REFLEX MICROSCOPIC
Bilirubin, UA: NEGATIVE
Glucose, UA: NEGATIVE
Ketones, UA: NEGATIVE
Nitrite, UA: NEGATIVE
Protein,UA: NEGATIVE
Specific Gravity, UA: 1.01 (ref 1.005–1.030)
Urobilinogen, Ur: 0.2 mg/dL (ref 0.2–1.0)
pH, UA: 6 (ref 5.0–7.5)

## 2021-09-02 ENCOUNTER — Ambulatory Visit (INDEPENDENT_AMBULATORY_CARE_PROVIDER_SITE_OTHER): Payer: Commercial Managed Care - PPO | Admitting: Urology

## 2021-09-02 ENCOUNTER — Encounter: Payer: Self-pay | Admitting: Urology

## 2021-09-02 VITALS — BP 131/89 | HR 66 | Ht 73.0 in | Wt 216.0 lb

## 2021-09-02 DIAGNOSIS — N401 Enlarged prostate with lower urinary tract symptoms: Secondary | ICD-10-CM | POA: Diagnosis not present

## 2021-09-02 DIAGNOSIS — N400 Enlarged prostate without lower urinary tract symptoms: Secondary | ICD-10-CM

## 2021-09-02 NOTE — Progress Notes (Signed)
History of Present Illness: Here for follow up TURP.  He underwent this procedure on 1.5.2023.  15 g of tissue resected.  All benign.  [id number]: He is here to check.  He has been doing well since his last/postoperative visit.  Reassess Neuromaquel endovascular repair aggressive he has had no blood in his urine.  He is quite happy about his symptoms  Past Medical History:  Diagnosis Date   Asthma    as a child only   Gout    History of kidney stones    Hypertension     Past Surgical History:  Procedure Laterality Date   ANAL FISSURE REPAIR  01/20/2000   COLONOSCOPY  10/31/2010   Dr.Jacobs 10/2020   LITHOTRIPSY  01/20/2003   kidney stones   TRANSURETHRAL RESECTION OF PROSTATE N/A 01/23/2021   Procedure: TRANSURETHRAL RESECTION OF THE PROSTATE (TURP);  Surgeon: [name], MD;  Location: [facility];  Service: Urology;  Laterality: N/A;    Home Medications:  Allergies as of 09/02/2021       Reactions   B[place] Nut (berthollefia [place]) Skin Test Anaphylaxis   Bactrim [sulfamethoxazole-trimethoprim] Nausea Only   Benazepril Hcl Swelling   angioedema   Norvasc [amlodipine] Other (See Comments)   edema        Medication List        Accurate as of September 02, 2021  8:28 AM. If you have any questions, ask your nurse or doctor.          acetaminophen 500 MG tablet Commonly known as: TYLENOL Take 500 mg by mouth every 6 (six) hours as needed. Only as needed hasnt taken in awhile 01/16/2021   allopurinol 300 MG tablet Commonly known as: ZYLOPRIM 1 tablet   allopurinol 300 MG tablet Commonly known as: ZYLOPRIM Take 300 mg by mouth daily.   amLODipine 5 MG tablet Commonly known as: NORVASC Take 5 mg by mouth daily.   carvedilol 25 MG tablet Commonly known as: COREG Take 25 mg by mouth 2 (two) times daily.   hydrALAZINE 25 MG tablet Commonly known as: APRESOLINE Take 25 mg by mouth 2 (two) times daily.   oxybutynin 5 MG  tablet Commonly known as: DITROPAN Take 1 tablet (5 mg total) by mouth every 8 (eight) hours as needed for up to 15 doses for bladder spasms.   VITAMIN D3 PO Take by mouth.        Allergies:  Allergies  Allergen Reactions   Bolivia Nut Daisey Must) Skin Test Anaphylaxis   Bactrim [Sulfamethoxazole-Trimethoprim] Nausea Only   Benazepril Hcl Swelling    angioedema   Norvasc [Amlodipine] Other (See Comments)    edema    Family History  Problem Relation Age of Onset   Multiple myeloma Mother    ALS Sister    Colon cancer Neg Hx    Stomach cancer Neg Hx    Rectal cancer Neg Hx    Esophageal cancer Neg Hx    Colon polyps Neg Hx     Social History:  reports that he has never smoked. He has never used smokeless tobacco. He reports that he does not drink alcohol and does not use drugs.  ROS: A complete review of systems was performed.  All systems are negative except for pertinent findings as noted.  Physical Exam:  Vital signs in last 24 hours: There were no vitals taken for this visit. Constitutional:  Alert and oriented, No acute distress Cardiovascular: Regular rate  Respiratory: Normal respiratory effort  GI: Abdomen is soft, nontender, nondistended, no abdominal masses. No CVAT.  Genitourinary: Normal male phallus, testes are descended bilaterally and non-tender and without masses, scrotum is normal in appearance without lesions or masses, perineum is normal on inspection. Lymphatic: No lymphadenopathy Neurologic: Grossly intact, no focal deficits Psychiatric: Normal mood and affect  I have reviewed prior pt notes  I have reviewed urinalysis results  I have independently reviewed prior imaging  I have reviewed prior PSA/pathology results     Impression/Assessment:  BPH w/ sx's--doing well following TURP in Jan 2023  Plan:  I'll have him come back on an as needed basis--PSA checks per Dr Woody Seller

## 2021-09-04 LAB — URINALYSIS, ROUTINE W REFLEX MICROSCOPIC
Bilirubin, UA: NEGATIVE
Glucose, UA: NEGATIVE
Ketones, UA: NEGATIVE
Leukocytes,UA: NEGATIVE
Nitrite, UA: NEGATIVE
Protein,UA: NEGATIVE
RBC, UA: NEGATIVE
Specific Gravity, UA: 1.02 (ref 1.005–1.030)
Urobilinogen, Ur: 0.2 mg/dL (ref 0.2–1.0)
pH, UA: 5.5 (ref 5.0–7.5)

## 2022-11-24 ENCOUNTER — Other Ambulatory Visit (HOSPITAL_COMMUNITY): Payer: Self-pay

## 2023-06-15 ENCOUNTER — Other Ambulatory Visit (HOSPITAL_COMMUNITY): Payer: Self-pay | Admitting: Internal Medicine

## 2023-06-15 DIAGNOSIS — R0602 Shortness of breath: Secondary | ICD-10-CM

## 2023-06-15 DIAGNOSIS — R634 Abnormal weight loss: Secondary | ICD-10-CM

## 2023-06-18 ENCOUNTER — Ambulatory Visit (HOSPITAL_COMMUNITY)
Admission: RE | Admit: 2023-06-18 | Discharge: 2023-06-18 | Disposition: A | Discharge status: Home / Self Care | Source: Ambulatory Visit | Attending: Internal Medicine | Admitting: Internal Medicine

## 2023-06-18 DIAGNOSIS — R634 Abnormal weight loss: Secondary | ICD-10-CM | POA: Diagnosis present

## 2023-06-18 DIAGNOSIS — R0602 Shortness of breath: Secondary | ICD-10-CM | POA: Diagnosis present

## 2023-08-09 ENCOUNTER — Emergency Department
Admission: EM | Admit: 2023-08-09 | Discharge: 2023-08-09 | Discharge status: Against Medical Advice | Attending: Emergency Medicine | Admitting: Emergency Medicine

## 2023-08-09 ENCOUNTER — Other Ambulatory Visit: Payer: Self-pay

## 2023-08-09 DIAGNOSIS — Z5321 Procedure and treatment not carried out due to patient leaving prior to being seen by health care provider: Secondary | ICD-10-CM | POA: Diagnosis not present

## 2023-08-09 DIAGNOSIS — R531 Weakness: Secondary | ICD-10-CM | POA: Diagnosis not present

## 2023-08-09 DIAGNOSIS — R251 Tremor, unspecified: Secondary | ICD-10-CM | POA: Diagnosis present

## 2023-08-09 LAB — COMPREHENSIVE METABOLIC PANEL WITH GFR
ALT: 9 U/L (ref 0–44)
AST: 19 U/L (ref 15–41)
Albumin: 3.6 g/dL (ref 3.5–5.0)
Alkaline Phosphatase: 50 U/L (ref 38–126)
Anion gap: 10 (ref 5–15)
BUN: 15 mg/dL (ref 8–23)
CO2: 25 mmol/L (ref 22–32)
Calcium: 9.3 mg/dL (ref 8.9–10.3)
Chloride: 107 mmol/L (ref 98–111)
Creatinine, Ser: 1.16 mg/dL (ref 0.61–1.24)
GFR, Estimated: >60 mL/min (ref >60)
Glucose, Bld: 91 mg/dL (ref 70–99)
Potassium: 4.1 mmol/L (ref 3.5–5.1)
Sodium: 142 mmol/L (ref 135–145)
Total Bilirubin: 0.8 mg/dL (ref 0.0–1.2)
Total Protein: 7.2 g/dL (ref 6.5–8.1)

## 2023-08-09 LAB — CBC
HCT: 37.6 % — ABNORMAL LOW (ref 39.0–52.0)
Hemoglobin: 12.4 g/dL — ABNORMAL LOW (ref 13.0–17.0)
MCH: 30.5 pg (ref 26.0–34.0)
MCHC: 33 g/dL (ref 30.0–36.0)
MCV: 92.6 fL (ref 80.0–100.0)
Platelets: 161 K/uL (ref 150–400)
RBC: 4.06 MIL/uL — ABNORMAL LOW (ref 4.22–5.81)
RDW: 13.6 % (ref 11.5–15.5)
WBC: 3.6 K/uL — ABNORMAL LOW (ref 4.0–10.5)
nRBC: 0 % (ref 0.0–0.2)

## 2023-08-09 LAB — CBG MONITORING, ED: Glucose-Capillary: 82 mg/dL (ref 70–99)

## 2023-08-09 NOTE — ED Triage Notes (Signed)
 Brought over from [facility] [place]. C/o bilateral hand and leg weakness. Gradually getting worse the past month. Tremors present. Denies SOB or CP.  Family reports patient is presenting in slow motion recently.   Recently seen by PCP and had CT of chest and pelvis for chest pain and weight loss

## 2023-08-17 ENCOUNTER — Other Ambulatory Visit (HOSPITAL_COMMUNITY): Payer: Self-pay | Admitting: Internal Medicine

## 2023-08-17 DIAGNOSIS — R42 Dizziness and giddiness: Secondary | ICD-10-CM

## 2023-08-20 ENCOUNTER — Ambulatory Visit (HOSPITAL_COMMUNITY)
Admission: RE | Admit: 2023-08-20 | Discharge: 2023-08-20 | Disposition: A | Discharge status: Home / Self Care | Source: Ambulatory Visit | Attending: Internal Medicine | Admitting: Internal Medicine

## 2023-08-20 DIAGNOSIS — R42 Dizziness and giddiness: Secondary | ICD-10-CM | POA: Diagnosis present

## 2023-12-09 ENCOUNTER — Encounter: Payer: Self-pay | Admitting: Diagnostic Neuroimaging

## 2023-12-09 ENCOUNTER — Ambulatory Visit: Admitting: Diagnostic Neuroimaging

## 2023-12-09 VITALS — BP 169/104 | HR 73 | Ht 72.0 in | Wt 190.0 lb

## 2023-12-09 DIAGNOSIS — G20A1 Parkinson's disease without dyskinesia, without mention of fluctuations: Secondary | ICD-10-CM

## 2023-12-09 MED ORDER — CARBIDOPA-LEVODOPA 25-100 MG PO TABS: 1.0000 | ORAL_TABLET | Freq: Three times a day (TID) | ORAL | 6 refills | Status: DC

## 2023-12-09 NOTE — Progress Notes (Signed)
 [facility]  PATIENT: Timothy Hahn DOB: 1960/08/19  REFERRING CLINICIAN: [name], MD HISTORY FROM: patient and wife REASON FOR VISIT: new consult   HISTORICAL  CHIEF COMPLAINT:  Chief Complaint  Patient presents with   RM 6     Patient is here with [name] for Tremor, ataxia - first visit would like to discuss CT scan and MRI    BP     Back pain all day - BP was 140/85 this morning. No issues with blurred vision, headaches, dizziness. Took BP meds this morning as well     HISTORY OF PRESENT ILLNESS:   63 year old male here for evaluation of tremor, dizziness, gait difficulty.  Since 2024 patient has had gradual onset progressive intermittent lightheadedness, dizziness when he stands up.  Also has noted tremor in his hands, decreased appetite, gait and balance difficulty.  He feels like he moves in slow motion.  His steps have become shorter and more unsteady.  Difficulty with turning.  Speech is soft and hoarse.  Sometimes he has to cough to clear his throat frequently.  Has had some blood pressure fluctuation difficulties.   REVIEW OF SYSTEMS: Full 14 system review of systems performed and negative with exception of: as per HPI.  ALLERGIES: Allergies  Allergen Reactions   Timothy Hahn (Berthollefia [place]) Anaphylaxis   Bactrim [Sulfamethoxazole-Trimethoprim] Nausea Only   Benazepril Hcl Swelling    angioedema   Norvasc [Amlodipine] Other (See Comments)    edema    HOME MEDICATIONS: Outpatient Medications Prior to Visit  Medication Sig Dispense Refill   Cholecalciferol (VITAMIN D3 PO) Take by mouth. (Patient taking differently: Take 1 capsule by mouth daily in the afternoon. 1 capsule daily)     acetaminophen  (TYLENOL ) 500 MG tablet Take 500 mg by mouth every 6 (six) hours as needed. Only as needed hasnt taken in awhile 01/16/2021     allopurinol  (ZYLOPRIM ) 300 MG tablet Take 300 mg by mouth daily.     allopurinol   (ZYLOPRIM ) 300 MG tablet 1 tablet     amLODipine (NORVASC) 5 MG tablet Take 5 mg by mouth daily.     carvedilol  (COREG ) 25 MG tablet Take 25 mg by mouth 2 (two) times daily.     hydrALAZINE  (APRESOLINE ) 25 MG tablet Take 25 mg by mouth 2 (two) times daily.     oxybutynin  (DITROPAN ) 5 MG tablet Take 1 tablet (5 mg total) by mouth every 8 (eight) hours as needed for up to 15 doses for bladder spasms. (Patient not taking: Reported on 12/09/2023) 15 tablet 1   No facility-administered medications prior to visit.    PAST MEDICAL HISTORY: Past Medical History:  Diagnosis Date   Asthma    as a child only   Gout    History of kidney stones    Hypertension     PAST SURGICAL HISTORY: Past Surgical History:  Procedure Laterality Date   ANAL FISSURE REPAIR  01/20/2000   COLONOSCOPY  10/31/2010   Dr.Jacobs 10/2020   LITHOTRIPSY  01/20/2003   kidney stones   TRANSURETHRAL RESECTION OF PROSTATE N/A 01/23/2021   Procedure: TRANSURETHRAL RESECTION OF THE PROSTATE (TURP);  Surgeon: [name], MD;  Location: [facility];  Service: Urology;  Laterality: N/A;    FAMILY HISTORY: Family History  Problem Relation Age of Onset   Multiple myeloma Mother    ALS Sister    Colon cancer Neg Hx    Stomach cancer Neg Hx  Rectal cancer Neg Hx    Esophageal cancer Neg Hx    Colon polyps Neg Hx     SOCIAL HISTORY: Social History   Socioeconomic History   Marital status: Married    Spouse name: Not on file   Number of children: Not on file   Years of education: Not on file   Highest education level: Not on file  Occupational History   Not on file  Tobacco Use   Smoking status: Never   Smokeless tobacco: Never  Vaping Use   Vaping status: Never Used  Substance and Sexual Activity   Alcohol use: No   Drug use: No   Sexual activity: Not on file  Other Topics Concern   Not on file  Social History Narrative   1 cup of coffee weekly    Social Drivers of Health    Financial Resource Strain: Low Risk  (08/09/2023)   Received from [facility]   Overall Financial Resource Strain (CARDIA)    Difficulty of Paying Living Expenses: Not hard at all  Food Insecurity: No Food Insecurity (08/09/2023)   Received from [facility]   Hunger Vital Sign    Within the past 12 months, you worried that your food would run out before you got the money to buy more.: Never true    Within the past 12 months, the food you bought just didn't last and you didn't have money to get more.: Never true  Transportation Needs: No Transportation Needs (08/09/2023)   Received from [facility] - Transportation    In the past 12 months, has lack of transportation kept you from medical appointments or from getting medications?: No    Lack of Transportation (Non-Medical): No  Physical Activity: Not on file  Stress: Not on file  Social Connections: Not on file  Intimate Partner Violence: Not on file     PHYSICAL EXAM  GENERAL EXAM/CONSTITUTIONAL: Vitals:  Vitals:   12/09/23 1541  BP: (!) 169/104  Pulse: 73  SpO2: 99%  Weight: 190 lb (86.2 kg)  Height: 6' (1.829 m)   Body mass index is 25.77 kg/m. Wt Readings from Last 3 Encounters:  12/09/23 190 lb (86.2 kg)  08/09/23 205 lb (93 kg)  09/02/21 216 lb (98 kg)   Patient is in no distress; well developed, nourished and groomed; neck is supple  CARDIOVASCULAR: Examination of carotid arteries is normal; no carotid bruits Regular rate and rhythm, no murmurs Examination of peripheral vascular system by observation and palpation is normal  EYES: Ophthalmoscopic exam of optic discs and posterior segments is normal; no papilledema or hemorrhages No results found.  MUSCULOSKELETAL: Gait, strength, tone, movements noted in Neurologic exam below  NEUROLOGIC: MENTAL STATUS:      No data to display         awake, alert, oriented to person, place and  time recent and remote memory intact normal attention and concentration language fluent, comprehension intact, naming intact fund of knowledge appropriate  CRANIAL NERVE:  2nd - no papilledema on fundoscopic exam 2nd, 3rd, 4th, 6th - pupils equal and reactive to light, visual fields full to confrontation, extraocular muscles intact, no nystagmus 5th - facial sensation symmetric 7th - facial strength symmetric 8th - hearing intact 9th - palate elevates symmetrically, uvula midline 11th - shoulder shrug symmetric 12th - tongue protrusion midline MASKED FACIES HOARSE VOICE; SOFT VOICE  MOTOR:  COGWHEELING RIGIDITY, MILD BRADYKINESIA  normal bulk and tone,  full strength in the BUE, BLE  SENSORY:  normal and symmetric to light touch, temperature, vibration  COORDINATION:  finger-nose-finger, fine finger movements normal  REFLEXES:  deep tendon reflexes 1+and symmetric  GAIT/STATION:  narrow based gait; SLOW, SHORT STEP; UNSTEADY TURNING     DIAGNOSTIC DATA (LABS, IMAGING, TESTING) - I reviewed patient records, labs, notes, testing and imaging myself where available.  Lab Results  Component Value Date   WBC 3.6 (L) 08/09/2023   HGB 12.4 (L) 08/09/2023   HCT 37.6 (L) 08/09/2023   MCV 92.6 08/09/2023   PLT 161 08/09/2023      Component Value Date/Time   NA 142 08/09/2023 1118   K 4.1 08/09/2023 1118   CL 107 08/09/2023 1118   CO2 25 08/09/2023 1118   GLUCOSE 91 08/09/2023 1118   BUN 15 08/09/2023 1118   CREATININE 1.16 08/09/2023 1118   CALCIUM 9.3 08/09/2023 1118   PROT 7.2 08/09/2023 1118   ALBUMIN 3.6 08/09/2023 1118   AST 19 08/09/2023 1118   ALT 9 08/09/2023 1118   ALKPHOS 50 08/09/2023 1118   BILITOT 0.8 08/09/2023 1118   GFRNONAA >60 08/09/2023 1118   No results found for: CHOL, HDL, LDLCALC, LDLDIRECT, TRIG, CHOLHDL No results found for: YHAJ8R No results found for: VITAMINB12 No results found for: TSH   08/20/23 MRI brain [I  reviewed images myself and agree with interpretation. -VRP]  1.  No evidence of an acute intracranial abnormality. 2. Chronic small vessel ischemic changes within the cerebral white matter, moderate for age. 3. Mild generalized cerebral atrophy. 4. Paranasal sinus disease as described. 5. Small left mastoid effusion.     ASSESSMENT AND PLAN  63 y.o. year old male here with gradual onset since 2024 of progressive gait and balance difficulty, lightheadedness, dizziness, resting tremor, cogwheel rigidity, bradykinesia, masked facies with signs symptoms most consistent with idiopathic Parkinson disease.   Dx:  1. Parkinson's disease without dyskinesia or fluctuating manifestations (HCC)      PLAN:  Idiopathic Parkinson disease (since 2024)  - reviewed diagnosis, prognosis and treatment options; emphasize importance of exercise, fall precautions, safety considerations; recommend to hold off on driving until symptoms improve due to significant motor slowing  - start carbidopa  / levodopa  (25/100) half tab three times a day (~30-60 minutes before meals) x 1-2 weeks; then 1 tab three times a day (~30-60 minutes before meals)   - refer to PT evaluation  Orders Placed This Encounter  Procedures   Ambulatory referral to Physical Therapy   Meds ordered this encounter  Medications   carbidopa -levodopa  (SINEMET  IR) 25-100 MG tablet    Sig: Take 1 tablet by mouth 3 (three) times daily before meals.    Dispense:  90 tablet    Refill:  6   Return in about 2 months (around 02/08/2024).  I reviewed images, labs, notes, records myself. I summarized findings and reviewed with patient, for this high risk condition (new diagnosis parkinson's disease; fall risk) requiring high complexity decision making.    EDUARD FABIENE HANLON, MD 12/09/2023, 4:22 PM Certified in Neurology, Neurophysiology and Neuroimaging  [facility] [address] [place], [place] [address] [phone]

## 2023-12-09 NOTE — Patient Instructions (Signed)
  PARKINSONISM (since 2024) - start carbidopa / levodopa (25/100) half tab three times a day (~30-60 minutes before meals) x 1-2 weeks; then 1 tab three times a day (~30-60 minutes before meals)  - refer to PT / OT / ST evaluation

## 2023-12-10 ENCOUNTER — Encounter: Payer: Self-pay | Admitting: Diagnostic Neuroimaging

## 2024-01-03 NOTE — Progress Notes (Unsigned)
 OUTPATIENT PHYSICAL THERAPY EVALUATION   Patient Name: Timothy Hahn MRN: [id number] DOB:[birth year], 63 y.o., male Today's Date: 01/03/2024  END OF SESSION:   Past Medical History:  Diagnosis Date   Asthma    as a child only   Gout    History of kidney stones    Hypertension    Past Surgical History:  Procedure Laterality Date   ANAL FISSURE REPAIR  01/20/2000   COLONOSCOPY  10/31/2010   Dr.Jacobs 10/2020   LITHOTRIPSY  01/20/2003   kidney stones   TRANSURETHRAL RESECTION OF PROSTATE N/A 01/23/2021   Procedure: TRANSURETHRAL RESECTION OF THE PROSTATE (TURP);  Surgeon: [name], MD;  Location: [facility];  Service: Urology;  Laterality: N/A;   Patient Active Problem List   Diagnosis Date Noted   Enlarged prostate with urinary obstruction 01/23/2021    PCP: [name], MD   REFERRING PROVIDER: [name], MD   REFERRING DIAG: G20.A1 (ICD-10-CM) - Parkinson's disease without dyskinesia or fluctuating manifestation   Rationale for Evaluation and Treatment:  Rehabiliation  THERAPY DIAG:  No diagnosis found.  ONSET DATE: ***   SUBJECTIVE:                                                                                                                                                                                           SUBJECTIVE STATEMENT: ***  PERTINENT HISTORY:  See above PMH  PAIN: *** NPRS scale: /10 upon arrival Pain location: Pain description: constant, achy, sharp Aggravating factors:  Relieving factors: rest, meds   PRECAUTIONS: ,  {Therapy precautions:24002}  RED FLAGS: {PT Red Flags:29287}   WEIGHT BEARING RESTRICTIONS:  {Yes ***/No:24003}  FALLS:  Has patient fallen in last 6 months? {fallsyesno:27318}   OCCUPATION:  ***  PLOF:  {PLOF:24004}  PATIENT GOALS:  ***  OBJECTIVE:  Note: Objective measures were completed at Evaluation unless otherwise noted.    PATIENT SURVEYS:   Patient-Specific Activity Scoring Scheme  0 represents unable to perform. 10 represents able to perform at prior level. 0 1 2 3 4 5 6 7 8 9  10 (Date and Score)   Activity Eval     1. ***      2. ***      3. ***    4.    5.    Score ***    Total score = sum of the activity scores/number of activities Minimum detectable change (90%CI) for average score = 2 points Minimum detectable change (90%CI) for single activity score = 3 points     EDEMA:  {Yes/No:304960894}  POSTURE:  {posture:25561}  GAIT: Assistive device utilized: {  Assistive devices:23999} Level of assistance: {Levels of assistance:24026} Comments: ***    {PT ROM TABLES:29304}  SPECIAL TESTS:  {PT Special Tests:29286}  FUNCTIONAL TESTS:  {Functional tests:24029}                                                                                                                              TREATMENT DATE:  Eval HEP creation and review with demonstration and trial set preformed, see below for details Selfcare:    PATIENT EDUCATION: Education details: HEP, PT plan of care, selfcare Person educated: Patient Education method: Explanation, Demonstration, Verbal cues, and Handouts Education comprehension: verbalized understanding, further education recommended   HOME EXERCISE PROGRAM: ***  ASSESSMENT:  CLINICAL IMPRESSION: Patient is 63 y.o. year old male here with gradual onset since 2024 of progressive gait and balance difficulty, lightheadedness, dizziness, resting tremor, cogwheel rigidity, bradykinesia, masked facies with signs symptoms most consistent with idiopathic Parkinson disease.  Patient will benefit from skilled PT to improve overall function and to address impairments and limitations listed below.  OBJECTIVE IMPAIRMENTS: decreased activity tolerance for ADL's, difficulty walking, decreased balance, decreased endurance, decreased mobility, decreased ROM, decreased strength, impaired  flexibility, impaired UE/LE use, and pain.  ACTIVITY LIMITATIONS: bending, liftting, walking, standing, cleaning, community activity, driving, reaching, carry, occupation  PERSONAL FACTORS: see above PMH  also affecting patient's functional outcome.  REHAB POTENTIAL: {rehabpotential:25112}  CLINICAL DECISION MAKING: {clinical decision making:25114}  EVALUATION COMPLEXITY: {Evaluation complexity:25115}    GOALS: Short term PT Goals Target date: *** Pt will be I and compliant with HEP. Baseline:  Goal status: New Pt will decrease pain by 25% overall Baseline: Goal status: New  Long term PT goals Target date:*** Pt will improve ROM to [facility] to improve functional mobility Baseline: Goal status: New Pt will improve  strength to at least 4+/5 MMT to improve functional strength Baseline: Goal status: New Pt will improve Patient specific functional scale (PSFS) to at least /10 to show improved function level Baseline: Goal status: New Pt will reduce pain to overall less than 3/10 with usual activity and work activity. Baseline: Goal status: New Pt will be able to ambulate community distances at least 1000 ft WNL gait pattern without complaints Baseline: Goal status: New  PLAN: PT FREQUENCY: 1-3 times per week   PT DURATION: 6-8 weeks  PLANNED INTERVENTIONS  {rehab planned interventions:25118::97110-Therapeutic exercises,97530- Therapeutic [phone]- Neuromuscular re-education,97535- Self Rjmz,02859- Manual therapy,Patient/Family education}  PLAN FOR NEXT SESSION: *** NEXT MD VISIT: PIERRETTE Redell JONELLE Maranda, PT 01/03/2024, 10:16 AM

## 2024-01-03 NOTE — Progress Notes (Unsigned)
 OUTPATIENT PHYSICAL THERAPY EVALUATION   Patient Name: Timothy Hahn MRN: [id number] DOB:Nov 26, 1960, 63 y.o., male Today's Date: 01/03/2024  END OF SESSION:   Past Medical History:  Diagnosis Date   Asthma    as a child only   Gout    History of kidney stones    Hypertension    Past Surgical History:  Procedure Laterality Date   ANAL FISSURE REPAIR  01/20/2000   COLONOSCOPY  10/31/2010   Dr.Jacobs 10/2020   LITHOTRIPSY  01/20/2003   kidney stones   TRANSURETHRAL RESECTION OF PROSTATE N/A 01/23/2021   Procedure: TRANSURETHRAL RESECTION OF THE PROSTATE (TURP);  Surgeon: [name], MD;  Location: [facility];  Service: Urology;  Laterality: N/A;   Patient Active Problem List   Diagnosis Date Noted   Enlarged prostate with urinary obstruction 01/23/2021    PCP: [name], MD   REFERRING PROVIDER: [name], MD   REFERRING DIAG: G20.A1 (ICD-10-CM) - Parkinson's disease without dyskinesia or fluctuating manifestation   Rationale for Evaluation and Treatment:  Rehabiliation  THERAPY DIAG:  No diagnosis found.  ONSET DATE: ***   SUBJECTIVE:                                                                                                                                                                                           SUBJECTIVE STATEMENT: ***  PERTINENT HISTORY:  See above PMH  PAIN: *** NPRS scale: /10 upon arrival Pain location: Pain description: constant, achy, sharp Aggravating factors:  Relieving factors: rest, meds   PRECAUTIONS: ,  {Therapy precautions:24002}  RED FLAGS: {PT Red Flags:29287}   WEIGHT BEARING RESTRICTIONS:  {Yes ***/No:24003}  FALLS:  Has patient fallen in last 6 months? {fallsyesno:27318}   OCCUPATION:  ***  PLOF:  {PLOF:24004}  PATIENT GOALS:  ***  OBJECTIVE:  Note: Objective measures were completed at Evaluation unless otherwise noted.    PATIENT SURVEYS:   Patient-Specific Activity Scoring Scheme  0 represents unable to perform. 10 represents able to perform at prior level. 0 1 2 3 4 5 6 7 8 9  10 (Date and Score)   Activity Eval     1. ***      2. ***      3. ***    4.    5.    Score ***    Total score = sum of the activity scores/number of activities Minimum detectable change (90%CI) for average score = 2 points Minimum detectable change (90%CI) for single activity score = 3 points     EDEMA:  {Yes/No:304960894}  POSTURE:  {posture:25561}  GAIT: Assistive device utilized: {  Assistive devices:23999} Level of assistance: {Levels of assistance:24026} Comments: ***    {PT ROM TABLES:29304}  SPECIAL TESTS:  {PT Special Tests:29286}  FUNCTIONAL TESTS:  {Functional tests:24029}                                                                                                                              TREATMENT DATE:  Eval HEP creation and review with demonstration and trial set preformed, see below for details Selfcare:    PATIENT EDUCATION: Education details: HEP, PT plan of care, selfcare Person educated: Patient Education method: Explanation, Demonstration, Verbal cues, and Handouts Education comprehension: verbalized understanding, further education recommended   HOME EXERCISE PROGRAM: ***  ASSESSMENT:  CLINICAL IMPRESSION: Patient is 63 y.o. year old male here with gradual onset since 2024 of progressive gait and balance difficulty, lightheadedness, dizziness, resting tremor, cogwheel rigidity, bradykinesia, masked facies with signs symptoms most consistent with idiopathic Parkinson disease.  Patient will benefit from skilled PT to improve overall function and to address impairments and limitations listed below.  OBJECTIVE IMPAIRMENTS: decreased activity tolerance for ADL's, difficulty walking, decreased balance, decreased endurance, decreased mobility, decreased ROM, decreased strength, impaired  flexibility, impaired UE/LE use, and pain.  ACTIVITY LIMITATIONS: bending, liftting, walking, standing, cleaning, community activity, driving, reaching, carry, occupation  PERSONAL FACTORS: see above PMH  also affecting patient's functional outcome.  REHAB POTENTIAL: {rehabpotential:25112}  CLINICAL DECISION MAKING: {clinical decision making:25114}  EVALUATION COMPLEXITY: {Evaluation complexity:25115}    GOALS: Short term PT Goals Target date: *** Pt will be I and compliant with HEP. Baseline:  Goal status: New Pt will decrease pain by 25% overall Baseline: Goal status: New  Long term PT goals Target date:*** Pt will improve ROM to [facility] to improve functional mobility Baseline: Goal status: New Pt will improve  strength to at least 4+/5 MMT to improve functional strength Baseline: Goal status: New Pt will improve Patient specific functional scale (PSFS) to at least /10 to show improved function level Baseline: Goal status: New Pt will reduce pain to overall less than 3/10 with usual activity and work activity. Baseline: Goal status: New Pt will be able to ambulate community distances at least 1000 ft WNL gait pattern without complaints Baseline: Goal status: New  PLAN: PT FREQUENCY: 1-3 times per week   PT DURATION: 6-8 weeks  PLANNED INTERVENTIONS  {rehab planned interventions:25118::97110-Therapeutic exercises,97530- Therapeutic [phone]- Neuromuscular re-education,97535- Self Rjmz,02859- Manual therapy,Patient/Family education}  PLAN FOR NEXT SESSION: *** NEXT MD VISIT: PIERRETTE Redell JONELLE Maranda, PT 01/03/2024, 10:12 AM

## 2024-01-04 ENCOUNTER — Encounter: Payer: Self-pay | Admitting: Physical Therapy

## 2024-01-04 ENCOUNTER — Ambulatory Visit: Admitting: Physical Therapy

## 2024-01-04 DIAGNOSIS — M6281 Muscle weakness (generalized): Secondary | ICD-10-CM | POA: Diagnosis present

## 2024-01-04 DIAGNOSIS — G20A1 Parkinson's disease without dyskinesia, without mention of fluctuations: Secondary | ICD-10-CM | POA: Diagnosis not present

## 2024-01-04 DIAGNOSIS — R2689 Other abnormalities of gait and mobility: Secondary | ICD-10-CM | POA: Insufficient documentation

## 2024-01-04 NOTE — Progress Notes (Signed)
 OUTPATIENT PHYSICAL THERAPY EVALUATION   Patient Name: Timothy Hahn MRN: [id number] DOB:Jun 04, 1960, 63 y.o., male Today's Date: 01/04/2024  END OF SESSION:  PT End of Session - 01/04/24 0957     Visit Number 1    Number of Visits 16    Date for Recertification  03/28/24    PT Start Time 0845    PT Stop Time 0930    PT Time Calculation (min) 45 min    Activity Tolerance Patient tolerated treatment well    Behavior During Therapy [facility] for tasks assessed/performed          Past Medical History:  Diagnosis Date   Asthma    as a child only   Gout    History of kidney stones    Hypertension    Past Surgical History:  Procedure Laterality Date   ANAL FISSURE REPAIR  01/20/2000   COLONOSCOPY  10/31/2010   Dr.Jacobs 10/2020   LITHOTRIPSY  01/20/2003   kidney stones   TRANSURETHRAL RESECTION OF PROSTATE N/A 01/23/2021   Procedure: TRANSURETHRAL RESECTION OF THE PROSTATE (TURP);  Surgeon: [name], MD;  Location: [facility];  Service: Urology;  Laterality: N/A;   Patient Active Problem List   Diagnosis Date Noted   Enlarged prostate with urinary obstruction 01/23/2021    PCP: [name], MD   REFERRING PROVIDER: [name], MD   REFERRING DIAG: G20.A1 (ICD-10-CM) - Parkinson's disease without dyskinesia or fluctuating manifestation   Rationale for Evaluation and Treatment:  Rehabiliation  THERAPY DIAG:  Other abnormalities of gait and mobility  Muscle weakness (generalized)  ONSET DATE: Last 6 months   SUBJECTIVE:                                                                                                                                                                                           SUBJECTIVE STATEMENT: He says for last 6 months he started having difficulty with his movements, brain fog, balance, feels uncoordinated. He gets some tremors at times. He is on medication now. He does get sleepy a lot. He  does have some dizzy spells and balance deficits but denies falls   PERTINENT HISTORY:  See above PMH  PAIN:  Denies pain today at eval   PRECAUTIONS: ,  None  RED FLAGS: None   WEIGHT BEARING RESTRICTIONS:  No  FALLS:  Has patient fallen in last 6 months? No   OCCUPATION:  retired  PLOF:  Independent with basic ADLs  PATIENT GOALS:  Become stronger and improve activity  OBJECTIVE:  Note: Objective measures were completed at Evaluation unless otherwise noted.  PATIENT SURVEYS:  Patient-Specific Activity Scoring Scheme  0 represents unable to perform. 10 represents able to perform at prior level. 0 1 2 3 4 5 6 7 8 9  10 (Date and Score)   Activity Eval     1. Taking off clothes and putting back on 7    2. Balance 7    3. Standing and turning 5   4.    5.    Score 6.33/10    Total score = sum of the activity scores/number of activities Minimum detectable change (90%CI) for average score = 2 points Minimum detectable change (90%CI) for single activity score = 3 points   GAIT: Eval Assistive device utilized: None Level of assistance: Complete Independence Comments: mild gait unsteadiness at times, shuffling gait and lack of arm swing    UPPER EXTREMITY MMT: Eval 4/5 MMT grossly bilat    LOWER EXTREMITY MMT:   Eval 4/5 MMT grossly bilat    FUNCTIONAL TESTS:  01/04/24 5 times sit to stand: 26.6 seconds, without UE support Timed up and go (TUG): 21.07 seconds without UE support  Berg Balance Scale:  Item Test date: 01/04/24 Date:  Date:   Sitting to standing 4. able to stand without using hands and stabilize independently Insert SmartPhrase OPRCBERGREEVAL Insert SmartPhrase OPRCBERGREEVAL  2. Standing unsupported 4. able to stand safely for 2 minutes    3. Sitting with back unsupported, feet supported 4. able to sit safely and securely for 2 minutes    4. Standing to sitting 3. controls descent by using hands    5. Pivot transfer   4. able to transfer safely with minor use of hands    6. Standing unsupported with eyes closed 3. able to stand 10 seconds with supervision    7. Standing unsupported with feet together 3. able to place feet together independently and stand 1 minute with supervision    8. Reaching forward with outstretched arms while standing 3. can reach forward 12 cm (5 inches)    9. Pick up object from the floor from standing 3. able to pick up slipper but needs supervision    10. Turning to look behind over left and right shoulders while standing 4. looks behind from both sides and weight shifts well    11. Turn 360 degrees 2. able to turn 360 degrees safely but slowly    12. Place alternate foot on step or stool while standing unsupported 3. able to stand independently and complete 8 steps in > 20 seconds     13. Standing unsupported one foot in front 2. able to take small step independently and hold 30 seconds    14. Standing on one leg 1. tries to lift leg unable to hold 3 seconds but remains standing independently.      Total Score 43/56 Total Score:    Total Score:  TREATMENT DATE:  Eval HEP creation and review with demonstration and trial set preformed, see below for details Nu step L6 X 10 min UE/LE seat #15    PATIENT EDUCATION: Education details: HEP, PT plan of care, selfcare Person educated: Patient Education method: Explanation, Demonstration, Verbal cues, and Handouts Education comprehension: verbalized understanding, further education recommended   HOME EXERCISE PROGRAM: Access Code: [email or web address] URL: [email or web address] Date: 01/04/2024 Prepared by: Redell Moose  Exercises - Sit to Stand with Arm Swing  - 2 x daily - 6 x weekly - 1 sets - 10 reps - Sit to Stand with Immediate Step  - 2 x daily - 6 x weekly - 1 sets - 10 reps - Standing  Reach to Side with Weight Shift  - 2 x daily - 6 x weekly - 3 sets - 10 reps - Standing Reach to Opposite Side with Weight Shift  - 2 x daily - 6 x weekly - 1 sets - 10 reps - Staggered Stance Weight Shift with Arms Reaching  - 2 x daily - 6 x weekly - 1 sets - 10 reps - Step Forward with Arms Reaching to Sides  - 2 x daily - 6 x weekly - 3 sets - 10 reps - Step Sideways with Arms Reaching  - 2 x daily - 6 x weekly - 3 sets - 10 reps - Standing Tandem Balance with Counter Support  - 2 x daily - 6 x weekly - 1 sets - 3 reps - 30 hold  ASSESSMENT:  CLINICAL IMPRESSION: Patient is 63 y.o. year old male here with gradual onset since 2024 of progressive gait and balance difficulty, lightheadedness, dizziness, resting tremor, cogwheel rigidity, bradykinesia, with signs symptoms most consistent with idiopathic Parkinson disease.  Patient will benefit from skilled PT to improve overall function and to address impairments and limitations listed below.  OBJECTIVE IMPAIRMENTS: decreased activity tolerance for ADL's, difficulty walking, decreased balance, decreased endurance, decreased mobility, decreased ROM, decreased strength, impaired flexibility, impaired UE/LE use  ACTIVITY LIMITATIONS: bending, liftting, walking, standing, cleaning, community activity, driving, reaching, carry, dressing  PERSONAL FACTORS: see above PMH  also affecting patient's functional outcome.  REHAB POTENTIAL: Good  CLINICAL DECISION MAKING: Evolving/moderate complexity  EVALUATION COMPLEXITY: Moderate    GOALS: Short term PT Goals Target date: 02/01/2024    Pt will be I and compliant with HEP. Baseline:  Goal status: New Pt will improve TUG time to less than 18 seconds to show improved gait speed and balance. Baseline: Goal status: New  Long term PT goals Target date: 03/28/2024   Pt will improve TUG test to less than 15 seconds show improved gait speed, improved balance and decreased risk of  falling Baseline: Goal status: New Pt will improve  strength to at least 4+/5 MMT to improve functional strength Baseline: Goal status: New Pt will improve Patient specific functional scale (PSFS) to at least /10 to show improved function level Baseline: Goal status: New Pt will improve BERG balance test to at least 47 points to show improved balance and decreased risk of falling Baseline: Goal status: New Pt will improve 5 times sit to stand test without UE support to less than 15 seconds to show improved leg strength and balance.  Baseline: Goal status: New  PLAN: PT FREQUENCY: 1-3 times per week   PT DURATION: 6-8 weeks  PLANNED INTERVENTIONS  97110-Therapeutic exercises, 97530- Therapeutic activity, [id number]- Neuromuscular re-education, 97535- Self Care, [id number]- Manual therapy, and [id number]- Gait training  PLAN FOR NEXT  SESSION: review HEP and initiate PWR/BIG movements and gym activity for strength and endurance. Gait and balance NEXT MD VISIT: ?  Redell JONELLE Moose, PT,DPT 01/04/2024, 10:04 AM

## 2024-01-04 NOTE — Progress Notes (Signed)
 OUTPATIENT PHYSICAL THERAPY EVALUATION   Patient Name: Timothy Hahn MRN: [id number] DOB:08-Jun-1960, 63 y.o., male Today's Date: 01/04/2024  END OF SESSION:  PT End of Session - 01/04/24 0957     Visit Number 1    Number of Visits 16    Date for Recertification  03/28/24    PT Start Time 0845    PT Stop Time 0930    PT Time Calculation (min) 45 min    Activity Tolerance Patient tolerated treatment well    Behavior During Therapy [facility] for tasks assessed/performed          Past Medical History:  Diagnosis Date   Asthma    as a child only   Gout    History of kidney stones    Hypertension    Past Surgical History:  Procedure Laterality Date   ANAL FISSURE REPAIR  01/20/2000   COLONOSCOPY  10/31/2010   Dr.Jacobs 10/2020   LITHOTRIPSY  01/20/2003   kidney stones   TRANSURETHRAL RESECTION OF PROSTATE N/A 01/23/2021   Procedure: TRANSURETHRAL RESECTION OF THE PROSTATE (TURP);  Surgeon: [name], MD;  Location: [facility];  Service: Urology;  Laterality: N/A;   Patient Active Problem List   Diagnosis Date Noted   Enlarged prostate with urinary obstruction 01/23/2021    PCP: [name], MD   REFERRING PROVIDER: [name], MD   REFERRING DIAG: G20.A1 (ICD-10-CM) - Parkinson's disease without dyskinesia or fluctuating manifestation   Rationale for Evaluation and Treatment:  Rehabiliation  THERAPY DIAG:  Other abnormalities of gait and mobility - Plan: PT plan of care cert/re-cert  Muscle weakness (generalized) - Plan: PT plan of care cert/re-cert  ONSET DATE: Last 6 months   SUBJECTIVE:                                                                                                                                                                                           SUBJECTIVE STATEMENT: He says for last 6 months he started having difficulty with his movements, brain fog, balance, feels uncoordinated. He gets some  tremors at times. He is on medication now. He does get sleepy a lot. He does have some dizzy spells and balance deficits but denies falls   PERTINENT HISTORY:  See above PMH  PAIN:  Denies pain today at eval   PRECAUTIONS: ,  None  RED FLAGS: None   WEIGHT BEARING RESTRICTIONS:  No  FALLS:  Has patient fallen in last 6 months? No   OCCUPATION:  retired  PLOF:  Independent with basic ADLs  PATIENT GOALS:  Become stronger and improve activity  OBJECTIVE:  Note: Objective measures were completed at Evaluation unless otherwise noted.    PATIENT SURVEYS:  Patient-Specific Activity Scoring Scheme  0 represents unable to perform. 10 represents able to perform at prior level. 0 1 2 3 4 5 6 7 8 9  10 (Date and Score)   Activity Eval     1. Taking off clothes and putting back on 7    2. Balance 7    3. Standing and turning 5   4.    5.    Score 6.33/10    Total score = sum of the activity scores/number of activities Minimum detectable change (90%CI) for average score = 2 points Minimum detectable change (90%CI) for single activity score = 3 points   GAIT: Eval Assistive device utilized: None Level of assistance: Complete Independence Comments: mild gait unsteadiness at times, shuffling gait and lack of arm swing    UPPER EXTREMITY MMT: Eval 4/5 MMT grossly bilat    LOWER EXTREMITY MMT:   Eval 4/5 MMT grossly bilat    FUNCTIONAL TESTS:  01/04/24 5 times sit to stand: 26.6 seconds, without UE support Timed up and go (TUG): 21.07 seconds without UE support  Berg Balance Scale:  Item Test date: 01/04/24 Date:  Date:   Sitting to standing 4. able to stand without using hands and stabilize independently Insert SmartPhrase OPRCBERGREEVAL Insert SmartPhrase OPRCBERGREEVAL  2. Standing unsupported 4. able to stand safely for 2 minutes    3. Sitting with back unsupported, feet supported 4. able to sit safely and securely for 2 minutes    4.  Standing to sitting 3. controls descent by using hands    5. Pivot transfer  4. able to transfer safely with minor use of hands    6. Standing unsupported with eyes closed 3. able to stand 10 seconds with supervision    7. Standing unsupported with feet together 3. able to place feet together independently and stand 1 minute with supervision    8. Reaching forward with outstretched arms while standing 3. can reach forward 12 cm (5 inches)    9. Pick up object from the floor from standing 3. able to pick up slipper but needs supervision    10. Turning to look behind over left and right shoulders while standing 4. looks behind from both sides and weight shifts well    11. Turn 360 degrees 2. able to turn 360 degrees safely but slowly    12. Place alternate foot on step or stool while standing unsupported 3. able to stand independently and complete 8 steps in > 20 seconds     13. Standing unsupported one foot in front 2. able to take small step independently and hold 30 seconds    14. Standing on one leg 1. tries to lift leg unable to hold 3 seconds but remains standing independently.      Total Score 43/56 Total Score:    Total Score:  TREATMENT DATE:  Eval HEP creation and review with demonstration and trial set preformed, see below for details Nu step L6 X 10 min UE/LE seat #15    PATIENT EDUCATION: Education details: HEP, PT plan of care, selfcare Person educated: Patient Education method: Explanation, Demonstration, Verbal cues, and Handouts Education comprehension: verbalized understanding, further education recommended   HOME EXERCISE PROGRAM: Access Code: [email or web address] URL: [email or web address] Date: 01/04/2024 Prepared by: Redell Moose  Exercises - Sit to Stand with Arm Swing  - 2 x daily - 6 x weekly - 1 sets - 10 reps - Sit to Stand  with Immediate Step  - 2 x daily - 6 x weekly - 1 sets - 10 reps - Standing Reach to Side with Weight Shift  - 2 x daily - 6 x weekly - 3 sets - 10 reps - Standing Reach to Opposite Side with Weight Shift  - 2 x daily - 6 x weekly - 1 sets - 10 reps - Staggered Stance Weight Shift with Arms Reaching  - 2 x daily - 6 x weekly - 1 sets - 10 reps - Step Forward with Arms Reaching to Sides  - 2 x daily - 6 x weekly - 3 sets - 10 reps - Step Sideways with Arms Reaching  - 2 x daily - 6 x weekly - 3 sets - 10 reps - Standing Tandem Balance with Counter Support  - 2 x daily - 6 x weekly - 1 sets - 3 reps - 30 hold  ASSESSMENT:  CLINICAL IMPRESSION: Patient is 63 y.o. year old male here with gradual onset since 2024 of progressive gait and balance difficulty, lightheadedness, dizziness, resting tremor, cogwheel rigidity, bradykinesia, with signs symptoms most consistent with idiopathic Parkinson disease.  Patient will benefit from skilled PT to improve overall function and to address impairments and limitations listed below.  OBJECTIVE IMPAIRMENTS: decreased activity tolerance for ADL's, difficulty walking, decreased balance, decreased endurance, decreased mobility, decreased ROM, decreased strength, impaired flexibility, impaired UE/LE use  ACTIVITY LIMITATIONS: bending, liftting, walking, standing, cleaning, community activity, driving, reaching, carry, dressing  PERSONAL FACTORS: see above PMH  also affecting patient's functional outcome.  REHAB POTENTIAL: Good  CLINICAL DECISION MAKING: Evolving/moderate complexity  EVALUATION COMPLEXITY: Moderate    GOALS: Short term PT Goals Target date: 02/01/2024    Pt will be I and compliant with HEP. Baseline:  Goal status: New Pt will improve TUG time to less than 18 seconds to show improved gait speed and balance. Baseline: Goal status: New  Long term PT goals Target date: 03/28/2024   Pt will improve TUG test to less than 15 seconds show  improved gait speed, improved balance and decreased risk of falling Baseline: Goal status: New Pt will improve  strength to at least 4+/5 MMT to improve functional strength Baseline: Goal status: New Pt will improve Patient specific functional scale (PSFS) to at least /10 to show improved function level Baseline: Goal status: New Pt will improve BERG balance test to at least 47 points to show improved balance and decreased risk of falling Baseline: Goal status: New Pt will improve 5 times sit to stand test without UE support to less than 15 seconds to show improved leg strength and balance.  Baseline: Goal status: New  PLAN: PT FREQUENCY: 1-3 times per week   PT DURATION: 6-8 weeks  PLANNED INTERVENTIONS  97110-Therapeutic exercises, 97530- Therapeutic activity, [id number]- Neuromuscular re-education, 97535- Self Care, [id number]- Manual therapy, and 97116- Gait training  PLAN FOR NEXT  SESSION: review HEP and initiate PWR/BIG movements and gym activity for strength and endurance. Gait and balance NEXT MD VISIT: ?  Redell JONELLE Moose, PT,DPT 01/04/2024, 10:44 AM

## 2024-01-06 ENCOUNTER — Other Ambulatory Visit (HOSPITAL_BASED_OUTPATIENT_CLINIC_OR_DEPARTMENT_OTHER): Payer: Self-pay

## 2024-01-06 MED FILL — Influenza Virus Vaccine Split PF Susp Pref Syringe 0.5 ML: 0.5000 mL | INTRAMUSCULAR | 1 days supply | Qty: 0.5 | Fill #0 | Status: AC

## 2024-01-12 ENCOUNTER — Ambulatory Visit: Admitting: Physical Therapy

## 2024-01-12 ENCOUNTER — Encounter: Payer: Self-pay | Admitting: Physical Therapy

## 2024-01-12 DIAGNOSIS — R2689 Other abnormalities of gait and mobility: Secondary | ICD-10-CM | POA: Diagnosis not present

## 2024-01-12 DIAGNOSIS — M6281 Muscle weakness (generalized): Secondary | ICD-10-CM

## 2024-01-12 NOTE — Therapy (Signed)
 " OUTPATIENT PHYSICAL THERAPY EVALUATION   Patient Name: Timothy Hahn MRN: [id number] DOB:May 06, 1960, 63 y.o., male Today's Date: 01/04/2024  END OF SESSION:  PT End of Session - 01/12/24 0923     Visit Number 2    Number of Visits 16    Date for Recertification  03/28/24    Authorization Type [employer]    PT Start Time [id number]    PT Stop Time 1005    PT Time Calculation (min) 40 min    Activity Tolerance Patient tolerated treatment well    Behavior During Therapy [facility] for tasks assessed/performed          Past Medical History:  Diagnosis Date   Asthma    as a child only   Gout    History of kidney stones    Hypertension    Past Surgical History:  Procedure Laterality Date   ANAL FISSURE REPAIR  01/20/2000   COLONOSCOPY  10/31/2010   Dr.Jacobs 10/2020   LITHOTRIPSY  01/20/2003   kidney stones   TRANSURETHRAL RESECTION OF PROSTATE N/A 01/23/2021   Procedure: TRANSURETHRAL RESECTION OF THE PROSTATE (TURP);  Surgeon: [name], MD;  Location: [facility];  Service: Urology;  Laterality: N/A;   Patient Active Problem List   Diagnosis Date Noted   Enlarged prostate with urinary obstruction 01/23/2021    PCP: [name], MD   REFERRING PROVIDER: [name], MD   REFERRING DIAG: G20.A1 (ICD-10-CM) - Parkinson's disease without dyskinesia or fluctuating manifestation   Rationale for Evaluation and Treatment:  Rehabiliation  THERAPY DIAG:  Other abnormalities of gait and mobility - Plan: PT plan of care cert/re-cert  Muscle weakness (generalized) - Plan: PT plan of care cert/re-cert  ONSET DATE: Last 6 months   SUBJECTIVE:                                                                                                                                                                                           SUBJECTIVE STATEMENT:  Pt states it's still hard for him to balance. Has been doing his exercises. The stretching feels  good. Pt states his knee is sore from what he did last time. Has been using voltaren  From eval: He says for last 6 months he started having difficulty with his movements, brain fog, balance, feels uncoordinated. He gets some tremors at times. He is on medication now. He does get sleepy a lot. He does have some dizzy spells and balance deficits but denies falls   PERTINENT HISTORY:  See above PMH  PAIN:  Denies pain today at eval   PRECAUTIONS: ,  None  RED FLAGS: None   WEIGHT BEARING RESTRICTIONS:  No  FALLS:  Has patient fallen in last 6 months? No   OCCUPATION:  retired  PLOF:  Independent with basic ADLs  PATIENT GOALS:  Become stronger and improve activity  OBJECTIVE:  Note: Objective measures were completed at Evaluation unless otherwise noted.    PATIENT SURVEYS:  Patient-Specific Activity Scoring Scheme  0 represents unable to perform. 10 represents able to perform at prior level. 0 1 2 3 4 5 6 7 8 9  10 (Date and Score)  Activity Eval     1. Taking off clothes and putting back on 7    2. Balance 7    3. Standing and turning 5   4.    5.    Score 6.33/10    Total score = sum of the activity scores/number of activities Minimum detectable change (90%CI) for average score = 2 points Minimum detectable change (90%CI) for single activity score = 3 points   GAIT: Eval Assistive device utilized: None Level of assistance: Complete Independence Comments: mild gait unsteadiness at times, shuffling gait and lack of arm swing   UPPER EXTREMITY MMT: Eval 4/5 MMT grossly bilat    LOWER EXTREMITY MMT:   Eval 4/5 MMT grossly bilat    FUNCTIONAL TESTS:  01/04/24 5 times sit to stand: 26.6 seconds, without UE support Timed up and go (TUG): 21.07 seconds without UE support  Berg Balance Scale:  Item Test date: 01/04/24 Date:  Date:   Sitting to standing 4. able to stand without using hands and stabilize independently Insert SmartPhrase  OPRCBERGREEVAL Insert SmartPhrase OPRCBERGREEVAL  2. Standing unsupported 4. able to stand safely for 2 minutes    3. Sitting with back unsupported, feet supported 4. able to sit safely and securely for 2 minutes    4. Standing to sitting 3. controls descent by using hands    5. Pivot transfer  4. able to transfer safely with minor use of hands    6. Standing unsupported with eyes closed 3. able to stand 10 seconds with supervision    7. Standing unsupported with feet together 3. able to place feet together independently and stand 1 minute with supervision    8. Reaching forward with outstretched arms while standing 3. can reach forward 12 cm (5 inches)    9. Pick up object from the floor from standing 3. able to pick up slipper but needs supervision    10. Turning to look behind over left and right shoulders while standing 4. looks behind from both sides and weight shifts well    11. Turn 360 degrees 2. able to turn 360 degrees safely but slowly    12. Place alternate foot on step or stool while standing unsupported 3. able to stand independently and complete 8 steps in > 20 seconds     13. Standing unsupported one foot in front 2. able to take small step independently and hold 30 seconds    14. Standing on one leg 1. tries to lift leg unable to hold 3 seconds but remains standing independently.      Total Score 43/56 Total Score:    Total Score:  TREATMENT DATE:  01/12/24 Nustep L5-8 x 10 min UEs/LEs PWR! Up, fwd step, twist, rock x10 each 1/2 tandem stance 3x30 R&L 3 way hip slider 2x10 R&L Fwd walking 2x60' large reciprocal arm swings and step lengths Backward walking 2x15'   Eval HEP creation and review with demonstration and trial set preformed, see below for details Nu step L6 X 10 min UE/LE seat #15    PATIENT EDUCATION: Education details: HEP,  PT plan of care, selfcare Person educated: Patient Education method: Explanation, Demonstration, Verbal cues, and Handouts Education comprehension: verbalized understanding, further education recommended   HOME EXERCISE PROGRAM: Access Code: [email or web address] URL: [email or web address] Date: 01/04/2024 Prepared by: Redell Moose  Exercises - Sit to Stand with Arm Swing  - 2 x daily - 6 x weekly - 1 sets - 10 reps - Sit to Stand with Immediate Step  - 2 x daily - 6 x weekly - 1 sets - 10 reps - Standing Reach to Side with Weight Shift  - 2 x daily - 6 x weekly - 3 sets - 10 reps - Standing Reach to Opposite Side with Weight Shift  - 2 x daily - 6 x weekly - 1 sets - 10 reps - Staggered Stance Weight Shift with Arms Reaching  - 2 x daily - 6 x weekly - 1 sets - 10 reps - Step Forward with Arms Reaching to Sides  - 2 x daily - 6 x weekly - 3 sets - 10 reps - Step Sideways with Arms Reaching  - 2 x daily - 6 x weekly - 3 sets - 10 reps - Standing Tandem Balance with Counter Support  - 2 x daily - 6 x weekly - 1 sets - 3 reps - 30 hold  ASSESSMENT:  CLINICAL IMPRESSION:  Reviewed HEP with pt demonstrating good understanding. Modified balance exercises and working on improving single limb stability and postural strength. Pt is highly challenged with maintaining trunk and center of balance with backwards walking.   From eval: Patient is 63 y.o. year old male here with gradual onset since 2024 of progressive gait and balance difficulty, lightheadedness, dizziness, resting tremor, cogwheel rigidity, bradykinesia, with signs symptoms most consistent with idiopathic Parkinson disease.  Patient will benefit from skilled PT to improve overall function and to address impairments and limitations listed below.  OBJECTIVE IMPAIRMENTS: decreased activity tolerance for ADL's, difficulty walking, decreased balance, decreased endurance, decreased mobility, decreased ROM, decreased strength, impaired  flexibility, impaired UE/LE use  ACTIVITY LIMITATIONS: bending, liftting, walking, standing, cleaning, community activity, driving, reaching, carry, dressing  PERSONAL FACTORS: see above PMH  also affecting patient's functional outcome.  REHAB POTENTIAL: Good  CLINICAL DECISION MAKING: Evolving/moderate complexity  EVALUATION COMPLEXITY: Moderate    GOALS: Short term PT Goals Target date: 02/01/2024    Pt will be I and compliant with HEP. Baseline:  Goal status: New Pt will improve TUG time to less than 18 seconds to show improved gait speed and balance. Baseline: Goal status: New  Long term PT goals Target date: 03/28/2024   Pt will improve TUG test to less than 15 seconds show improved gait speed, improved balance and decreased risk of falling Baseline: Goal status: New Pt will improve  strength to at least 4+/5 MMT to improve functional strength Baseline: Goal status: New Pt will improve Patient specific functional scale (PSFS) to at least /10 to show improved function level Baseline: Goal status: New Pt will improve BERG balance test to at least 47 points to show  improved balance and decreased risk of falling Baseline: Goal status: New Pt will improve 5 times sit to stand test without UE support to less than 15 seconds to show improved leg strength and balance.  Baseline: Goal status: New  PLAN: PT FREQUENCY: 1-3 times per week   PT DURATION: 6-8 weeks  PLANNED INTERVENTIONS  97110-Therapeutic exercises, 97530- Therapeutic activity, [id number]- Neuromuscular re-education, 97535- Self Care, [id number]- Manual therapy, and 97116- Gait training  PLAN FOR NEXT SESSION: review HEP and initiate PWR/BIG movements and gym activity for strength and endurance. Gait and balance NEXT MD VISIT: ?  Redell JONELLE Moose, PT,DPT 01/04/2024, 10:44 AM  "

## 2024-01-19 ENCOUNTER — Ambulatory Visit: Admitting: Physical Therapy

## 2024-01-26 ENCOUNTER — Encounter: Payer: Self-pay | Admitting: Physical Therapy

## 2024-01-26 ENCOUNTER — Ambulatory Visit: Attending: Diagnostic Neuroimaging | Admitting: Physical Therapy

## 2024-01-26 DIAGNOSIS — M6281 Muscle weakness (generalized): Secondary | ICD-10-CM | POA: Diagnosis present

## 2024-01-26 DIAGNOSIS — R2689 Other abnormalities of gait and mobility: Secondary | ICD-10-CM | POA: Diagnosis present

## 2024-01-26 NOTE — Therapy (Signed)
 " OUTPATIENT PHYSICAL THERAPY TREATMENT   Patient Name: Timothy Hahn MRN: [id number] DOB:Jan 06, 1961, 64 y.o., male Today's Date: 01/04/2024  END OF SESSION:  PT End of Session - 01/26/24 0844     Visit Number 3    Number of Visits 16    Date for Recertification  03/28/24    Authorization Type [employer]    PT Start Time [phone]    PT Stop Time 0925    PT Time Calculation (min) 41 min    Activity Tolerance Patient tolerated treatment well    Behavior During Therapy [facility] for tasks assessed/performed          Past Medical History:  Diagnosis Date   Asthma    as a child only   Gout    History of kidney stones    Hypertension    Past Surgical History:  Procedure Laterality Date   ANAL FISSURE REPAIR  01/20/2000   COLONOSCOPY  10/31/2010   Dr.Jacobs 10/2020   LITHOTRIPSY  01/20/2003   kidney stones   TRANSURETHRAL RESECTION OF PROSTATE N/A 01/23/2021   Procedure: TRANSURETHRAL RESECTION OF THE PROSTATE (TURP);  Surgeon: Timothy Senior, MD;  Location: [facility];  Service: Urology;  Laterality: N/A;   Patient Active Problem List   Diagnosis Date Noted   Enlarged prostate with urinary obstruction 01/23/2021    PCP: Timothy Leta NOVAK, MD   REFERRING PROVIDER: [name], MD   REFERRING DIAG: G20.A1 (ICD-10-CM) - Parkinson's disease without dyskinesia or fluctuating manifestation   Rationale for Evaluation and Treatment:  Rehabiliation  THERAPY DIAG:  Other abnormalities of gait and mobility - Plan: PT plan of care cert/re-cert  Muscle weakness (generalized) - Plan: PT plan of care cert/re-cert  ONSET DATE: Last 6 months   SUBJECTIVE:                                                                                                                                                                                           SUBJECTIVE STATEMENT:  Pt has been working out -- tries to come to the gym 3 days a week. Feels it has been helping his  mobility. Can tell he can put on his clothes better.   From eval: He says for last 6 months he started having difficulty with his movements, brain fog, balance, feels uncoordinated. He gets some tremors at times. He is on medication now. He does get sleepy a lot. He does have some dizzy spells and balance deficits but denies falls   PERTINENT HISTORY:  See above PMH  PAIN:  Denies pain today at eval   PRECAUTIONS: ,  None  RED FLAGS: None   WEIGHT BEARING RESTRICTIONS:  No  FALLS:  Has patient fallen in last 6 months? No   OCCUPATION:  retired  PLOF:  Independent with basic ADLs  PATIENT GOALS:  Become stronger and improve activity  OBJECTIVE:  Note: Objective measures were completed at Evaluation unless otherwise noted.    PATIENT SURVEYS:  Patient-Specific Activity Scoring Scheme  0 represents unable to perform. 10 represents able to perform at prior level. 0 1 2 3 4 5 6 7 8 9  10 (Date and Score)  Activity Eval     1. Taking off clothes and putting back on 7    2. Balance 7    3. Standing and turning 5   4.    5.    Score 6.33/10    Total score = sum of the activity scores/number of activities Minimum detectable change (90%CI) for average score = 2 points Minimum detectable change (90%CI) for single activity score = 3 points   GAIT: Eval Assistive device utilized: None Level of assistance: Complete Independence Comments: mild gait unsteadiness at times, shuffling gait and lack of arm swing   UPPER EXTREMITY MMT: Eval 4/5 MMT grossly bilat    LOWER EXTREMITY MMT:   Eval 4/5 MMT grossly bilat    FUNCTIONAL TESTS:  01/04/24 5 times sit to stand: 26.6 seconds, without UE support Timed up and go (TUG): 21.07 seconds without UE support  Berg Balance Scale:  Item Test date: 01/04/24 Date:  Date:   Sitting to standing 4. able to stand without using hands and stabilize independently Insert SmartPhrase OPRCBERGREEVAL Insert SmartPhrase  OPRCBERGREEVAL  2. Standing unsupported 4. able to stand safely for 2 minutes    3. Sitting with back unsupported, feet supported 4. able to sit safely and securely for 2 minutes    4. Standing to sitting 3. controls descent by using hands    5. Pivot transfer  4. able to transfer safely with minor use of hands    6. Standing unsupported with eyes closed 3. able to stand 10 seconds with supervision    7. Standing unsupported with feet together 3. able to place feet together independently and stand 1 minute with supervision    8. Reaching forward with outstretched arms while standing 3. can reach forward 12 cm (5 inches)    9. Pick up object from the floor from standing 3. able to pick up slipper but needs supervision    10. Turning to look behind over left and right shoulders while standing 4. looks behind from both sides and weight shifts well    11. Turn 360 degrees 2. able to turn 360 degrees safely but slowly    12. Place alternate foot on step or stool while standing unsupported 3. able to stand independently and complete 8 steps in > 20 seconds     13. Standing unsupported one foot in front 2. able to take small step independently and hold 30 seconds    14. Standing on one leg 1. tries to lift leg unable to hold 3 seconds but remains standing independently.      Total Score 43/56 Total Score:    Total Score:  TREATMENT DATE:  01/26/24 TRUE machine Lvl 4 to 7 x 10 min total, interval training with increase in level and RPM  Standing PWR! Up, rock, twist, fwd and bwd step x10 each; in sequence x5 Feet together on airex static standing eyes open x 30, head turns and head nods 2x30 each  01/12/24 Nustep L5-8 x 10 min UEs/LEs PWR! Up, fwd step, twist, rock x10 each 1/2 tandem stance 3x30 R&L 3 way hip slider 2x10 R&L Fwd walking 2x60' large reciprocal arm  swings and step lengths Backward walking 2x15'   Eval HEP creation and review with demonstration and trial set preformed, see below for details Nu step L6 X 10 min UE/LE seat #15    PATIENT EDUCATION: Education details: HEP, PT plan of care, selfcare Person educated: Patient Education method: Explanation, Demonstration, Verbal cues, and Handouts Education comprehension: verbalized understanding, further education recommended   HOME EXERCISE PROGRAM: Access Code: [email or web address] URL: [email or web address] Date: 01/04/2024 Prepared by: Redell Moose  Exercises - Sit to Stand with Arm Swing  - 2 x daily - 6 x weekly - 1 sets - 10 reps - Sit to Stand with Immediate Step  - 2 x daily - 6 x weekly - 1 sets - 10 reps - Standing Reach to Side with Weight Shift  - 2 x daily - 6 x weekly - 3 sets - 10 reps - Standing Reach to Opposite Side with Weight Shift  - 2 x daily - 6 x weekly - 1 sets - 10 reps - Staggered Stance Weight Shift with Arms Reaching  - 2 x daily - 6 x weekly - 1 sets - 10 reps - Step Forward with Arms Reaching to Sides  - 2 x daily - 6 x weekly - 3 sets - 10 reps - Step Sideways with Arms Reaching  - 2 x daily - 6 x weekly - 3 sets - 10 reps - Standing Tandem Balance with Counter Support  - 2 x daily - 6 x weekly - 1 sets - 3 reps - 30 hold  ASSESSMENT:  CLINICAL IMPRESSION:  Reviewed standing PWR! Moves to improve trunk rigidity, encourage large amplitude movements, and work on reactive balance/stepping. Able to tolerate performing in sequence today with an increased pace. Most challenged with PWR! Rock. Continued to work on trunk stability and control with weight shifting on compliant surface.   From eval: Patient is 64 y.o. year old male here with gradual onset since 2024 of progressive gait and balance difficulty, lightheadedness, dizziness, resting tremor, cogwheel rigidity, bradykinesia, with signs symptoms most consistent with idiopathic Parkinson disease.   Patient will benefit from skilled PT to improve overall function and to address impairments and limitations listed below.  OBJECTIVE IMPAIRMENTS: decreased activity tolerance for ADL's, difficulty walking, decreased balance, decreased endurance, decreased mobility, decreased ROM, decreased strength, impaired flexibility, impaired UE/LE use  ACTIVITY LIMITATIONS: bending, liftting, walking, standing, cleaning, community activity, driving, reaching, carry, dressing  PERSONAL FACTORS: see above PMH  also affecting patient's functional outcome.  REHAB POTENTIAL: Good  CLINICAL DECISION MAKING: Evolving/moderate complexity  EVALUATION COMPLEXITY: Moderate    GOALS: Short term PT Goals Target date: 02/01/2024    Pt will be I and compliant with HEP. Baseline:  Goal status: New Pt will improve TUG time to less than 18 seconds to show improved gait speed and balance. Baseline: Goal status: New  Long term PT goals Target date: 03/28/2024   Pt will improve TUG test to less than 15 seconds show improved  gait speed, improved balance and decreased risk of falling Baseline: Goal status: New Pt will improve  strength to at least 4+/5 MMT to improve functional strength Baseline: Goal status: New Pt will improve Patient specific functional scale (PSFS) to at least /10 to show improved function level Baseline: Goal status: New Pt will improve BERG balance test to at least 47 points to show improved balance and decreased risk of falling Baseline: Goal status: New Pt will improve 5 times sit to stand test without UE support to less than 15 seconds to show improved leg strength and balance.  Baseline: Goal status: New  PLAN: PT FREQUENCY: 1-3 times per week   PT DURATION: 6-8 weeks  PLANNED INTERVENTIONS  97110-Therapeutic exercises, 97530- Therapeutic activity, [id number]- Neuromuscular re-education, 97535- Self Care, [id number]- Manual therapy, and 97116- Gait training  PLAN FOR NEXT SESSION:  review HEP and initiate PWR/BIG movements and gym activity for strength and endurance. Gait and balance NEXT MD VISIT: ?  Ewald Beg April Ma L Tanysha Quant, PT, DPT 01/26/2024, 9:29 AM  "

## 2024-01-28 ENCOUNTER — Encounter: Payer: Self-pay | Admitting: Physical Therapy

## 2024-01-28 ENCOUNTER — Ambulatory Visit: Admitting: Physical Therapy

## 2024-01-28 DIAGNOSIS — R2689 Other abnormalities of gait and mobility: Secondary | ICD-10-CM | POA: Diagnosis not present

## 2024-01-28 DIAGNOSIS — M6281 Muscle weakness (generalized): Secondary | ICD-10-CM

## 2024-01-28 NOTE — Therapy (Signed)
 " OUTPATIENT PHYSICAL THERAPY TREATMENT   Patient Name: Timothy Hahn MRN: [id number] DOB:1960/11/13, 64 y.o., male Today's Date: 01/04/2024  END OF SESSION:  PT End of Session - 01/28/24 0848     Visit Number 4    Number of Visits 16    Date for Recertification  03/28/24    Authorization Type [employer]    PT Start Time [phone]    PT Stop Time 0925    PT Time Calculation (min) 42 min    Activity Tolerance Patient tolerated treatment well    Behavior During Therapy [facility] for tasks assessed/performed           Past Medical History:  Diagnosis Date   Asthma    as a child only   Gout    History of kidney stones    Hypertension    Past Surgical History:  Procedure Laterality Date   ANAL FISSURE REPAIR  01/20/2000   COLONOSCOPY  10/31/2010   Dr.Jacobs 10/2020   LITHOTRIPSY  01/20/2003   kidney stones   TRANSURETHRAL RESECTION OF PROSTATE N/A 01/23/2021   Procedure: TRANSURETHRAL RESECTION OF THE PROSTATE (TURP);  Surgeon: [name], MD;  [facility]  Service: Urology;  Laterality: N/A;   Patient Active Problem List   Diagnosis Date Noted   Enlarged prostate with urinary obstruction 01/23/2021    PCP: [name], MD   REFERRING PROVIDER: [name], MD   REFERRING DIAG: G20.A1 (ICD-10-CM) - Parkinson's disease without dyskinesia or fluctuating manifestation   Rationale for Evaluation and Treatment:  Rehabiliation  THERAPY DIAG:  Other abnormalities of gait and mobility - Plan: PT plan of care cert/re-cert  Muscle weakness (generalized) - Plan: PT plan of care cert/re-cert  ONSET DATE: Last 6 months   SUBJECTIVE:                                                                                                                                                                                           SUBJECTIVE STATEMENT:  Pt states he was a little sore and tired after last session.   From eval: He says for last 6  months he started having difficulty with his movements, brain fog, balance, feels uncoordinated. He gets some tremors at times. He is on medication now. He does get sleepy a lot. He does have some dizzy spells and balance deficits but denies falls   PERTINENT HISTORY:  See above PMH  PAIN:  Denies pain today at eval   PRECAUTIONS: ,  None  RED FLAGS: None   WEIGHT BEARING RESTRICTIONS:  No  FALLS:  Has patient fallen in  last 6 months? No   OCCUPATION:  retired  PLOF:  Independent with basic ADLs  PATIENT GOALS:  Become stronger and improve activity  OBJECTIVE:  Note: Objective measures were completed at Evaluation unless otherwise noted.    PATIENT SURVEYS:  Patient-Specific Activity Scoring Scheme  0 represents unable to perform. 10 represents able to perform at prior level. 0 1 2 3 4 5 6 7 8 9  10 (Date and Score)  Activity Eval     1. Taking off clothes and putting back on 7    2. Balance 7    3. Standing and turning 5   4.    5.    Score 6.33/10    Total score = sum of the activity scores/number of activities Minimum detectable change (90%CI) for average score = 2 points Minimum detectable change (90%CI) for single activity score = 3 points   GAIT: Eval Assistive device utilized: None Level of assistance: Complete Independence Comments: mild gait unsteadiness at times, shuffling gait and lack of arm swing   UPPER EXTREMITY MMT: Eval 4/5 MMT grossly bilat    LOWER EXTREMITY MMT:   Eval 4/5 MMT grossly bilat    FUNCTIONAL TESTS:  01/04/24 5 times sit to stand: 26.6 seconds, without UE support Timed up and go (TUG): 21.07 seconds without UE support  Berg Balance Scale:  Item Test date: 01/04/24 Date:  Date:   Sitting to standing 4. able to stand without using hands and stabilize independently Insert SmartPhrase OPRCBERGREEVAL Insert SmartPhrase OPRCBERGREEVAL  2. Standing unsupported 4. able to stand safely for 2 minutes    3.  Sitting with back unsupported, feet supported 4. able to sit safely and securely for 2 minutes    4. Standing to sitting 3. controls descent by using hands    5. Pivot transfer  4. able to transfer safely with minor use of hands    6. Standing unsupported with eyes closed 3. able to stand 10 seconds with supervision    7. Standing unsupported with feet together 3. able to place feet together independently and stand 1 minute with supervision    8. Reaching forward with outstretched arms while standing 3. can reach forward 12 cm (5 inches)    9. Pick up object from the floor from standing 3. able to pick up slipper but needs supervision    10. Turning to look behind over left and right shoulders while standing 4. looks behind from both sides and weight shifts well    11. Turn 360 degrees 2. able to turn 360 degrees safely but slowly    12. Place alternate foot on step or stool while standing unsupported 3. able to stand independently and complete 8 steps in > 20 seconds     13. Standing unsupported one foot in front 2. able to take small step independently and hold 30 seconds    14. Standing on one leg 1. tries to lift leg unable to hold 3 seconds but remains standing independently.      Total Score 43/56 Total Score:    Total Score:  TREATMENT DATE:  01/28/24 TRUE machine Lvl 4 to 7 x 10 min total, interval training with increase in level and/or RPM periodically Standing PWR! Up, rock, twist, fwd and bwd step x5 each with two 1#; in sequence x5 Fwd walking like you're skiing with 1# working on big reciprocal arm swing 2x50' Bwd walking working on big steps back 2x50' Feet together on airex static standing eyes open x 30, head turns and head nods 2x30 each Feet apart on airex static standing eyes closed 2x 30, head turns and head nods x30 each Feet together on  firm surface eyes closed 2x30   01/26/24 TRUE machine Lvl 4 to 7 x 10 min total, interval training with increase in level and RPM  Standing PWR! Up, rock, twist, fwd and bwd step x10 each; in sequence x5 Feet together on airex static standing eyes open x 30, head turns and head nods 2x30 each   01/12/24 Nustep L5-8 x 10 min UEs/LEs PWR! Up, fwd step, twist, rock x10 each 1/2 tandem stance 3x30 R&L 3 way hip slider 2x10 R&L Fwd walking 2x60' large reciprocal arm swings and step lengths Backward walking 2x15'   PATIENT EDUCATION: Education details: HEP, PT plan of care, selfcare Person educated: Patient Education method: Explanation, Demonstration, Verbal cues, and Handouts Education comprehension: verbalized understanding, further education recommended   HOME EXERCISE PROGRAM: 01/28/24: Eyes closed feet together and backwards walking  Access Code: [email or web address] URL: [email or web address] Date: 01/04/2024 Prepared by: Redell Moose  Exercises - Sit to Stand with Arm Swing  - 2 x daily - 6 x weekly - 1 sets - 10 reps - Sit to Stand with Immediate Step  - 2 x daily - 6 x weekly - 1 sets - 10 reps - Standing Reach to Side with Weight Shift  - 2 x daily - 6 x weekly - 3 sets - 10 reps - Standing Reach to Opposite Side with Weight Shift  - 2 x daily - 6 x weekly - 1 sets - 10 reps - Staggered Stance Weight Shift with Arms Reaching  - 2 x daily - 6 x weekly - 1 sets - 10 reps - Step Forward with Arms Reaching to Sides  - 2 x daily - 6 x weekly - 3 sets - 10 reps - Step Sideways with Arms Reaching  - 2 x daily - 6 x weekly - 3 sets - 10 reps - Standing Tandem Balance with Counter Support  - 2 x daily - 6 x weekly - 1 sets - 3 reps - 30 hold  ASSESSMENT:  CLINICAL IMPRESSION:  Continued PWR! Moves. Added 1# for more strengthening. Much smoother with performing PWR! Moves in sequence. Working on improving reciprocal arm swing and backwards walking/weight shift. Challenged  with balance exercises requiring eyes closed.   From eval: Patient is 64 y.o. year old male here with gradual onset since 2024 of progressive gait and balance difficulty, lightheadedness, dizziness, resting tremor, cogwheel rigidity, bradykinesia, with signs symptoms most consistent with idiopathic Parkinson disease.  Patient will benefit from skilled PT to improve overall function and to address impairments and limitations listed below.  OBJECTIVE IMPAIRMENTS: decreased activity tolerance for ADL's, difficulty walking, decreased balance, decreased endurance, decreased mobility, decreased ROM, decreased strength, impaired flexibility, impaired UE/LE use  ACTIVITY LIMITATIONS: bending, liftting, walking, standing, cleaning, community activity, driving, reaching, carry, dressing  PERSONAL FACTORS: see above PMH  also affecting patient's functional outcome.  REHAB POTENTIAL: Good  CLINICAL DECISION MAKING: Evolving/moderate complexity  EVALUATION  COMPLEXITY: Moderate    GOALS: Short term PT Goals Target date: 02/01/2024    Pt will be I and compliant with HEP. Baseline:  Goal status: New  Pt will improve TUG time to less than 18 seconds to show improved gait speed and balance. Baseline: Goal status: New  Long term PT goals Target date: 03/28/2024   Pt will improve TUG test to less than 15 seconds show improved gait speed, improved balance and decreased risk of falling Baseline: Goal status: New Pt will improve  strength to at least 4+/5 MMT to improve functional strength Baseline: Goal status: New Pt will improve Patient specific functional scale (PSFS) to at least /10 to show improved function level Baseline: Goal status: New Pt will improve BERG balance test to at least 47 points to show improved balance and decreased risk of falling Baseline: Goal status: New Pt will improve 5 times sit to stand test without UE support to less than 15 seconds to show improved leg strength  and balance.  Baseline: Goal status: New  PLAN: PT FREQUENCY: 1-3 times per week   PT DURATION: 6-8 weeks  PLANNED INTERVENTIONS  97110-Therapeutic exercises, 97530- Therapeutic activity, [id number]- Neuromuscular re-education, 97535- Self Care, [id number]- Manual therapy, and 97116- Gait training  PLAN FOR NEXT SESSION: Check STGs. Review HEP and initiate PWR/BIG movements and gym activity for strength and endurance. Gait and balance NEXT MD VISIT: ?  Atalya Dano April Ma L Keidy Thurgood, PT, DPT 01/28/2024, 8:48 AM  "

## 2024-02-01 ENCOUNTER — Ambulatory Visit

## 2024-02-01 DIAGNOSIS — R2689 Other abnormalities of gait and mobility: Secondary | ICD-10-CM

## 2024-02-01 DIAGNOSIS — M6281 Muscle weakness (generalized): Secondary | ICD-10-CM

## 2024-02-01 NOTE — Therapy (Signed)
 " OUTPATIENT PHYSICAL THERAPY TREATMENT   Patient Name: Timothy Hahn MRN: [id number] DOB:[birth year], 64 y.o., male Today's Date: 01/04/2024  END OF SESSION:  PT End of Session - 02/01/24 1052     Visit Number 5    Number of Visits 16    Date for Recertification  03/28/24    Authorization Type [employer]    PT Start Time 1050    PT Stop Time 1130    PT Time Calculation (min) 40 min    Activity Tolerance Patient tolerated treatment well    Behavior During Therapy WFL for tasks assessed/performed           Past Medical History:  Diagnosis Date   Asthma    as a child only   Gout    History of kidney stones    Hypertension    Past Surgical History:  Procedure Laterality Date   ANAL FISSURE REPAIR  01/20/2000   COLONOSCOPY  10/31/2010   Dr.Jacobs 10/2020   LITHOTRIPSY  01/20/2003   kidney stones   TRANSURETHRAL RESECTION OF PROSTATE N/A 01/23/2021   Procedure: TRANSURETHRAL RESECTION OF THE PROSTATE (TURP);  Surgeon: [name], MD;  Location: [facility];  Service: Urology;  Laterality: N/A;   Patient Active Problem List   Diagnosis Date Noted   Enlarged prostate with urinary obstruction 01/23/2021    PCP: [name], MD   REFERRING PROVIDER: [name], MD   REFERRING DIAG: G20.A1 (ICD-10-CM) - Parkinson's disease without dyskinesia or fluctuating manifestation   Rationale for Evaluation and Treatment:  Rehabiliation  THERAPY DIAG:  Other abnormalities of gait and mobility - Plan: PT plan of care cert/re-cert  Muscle weakness (generalized) - Plan: PT plan of care cert/re-cert  ONSET DATE: Last 6 months   SUBJECTIVE:                                                                                                                                                                                           SUBJECTIVE STATEMENT:  Pt states his R knee is a little sore but feels better after he warms up.   From eval: He says for  last 6 months he started having difficulty with his movements, brain fog, balance, feels uncoordinated. He gets some tremors at times. He is on medication now. He does get sleepy a lot. He does have some dizzy spells and balance deficits but denies falls   PERTINENT HISTORY:  See above PMH  PAIN:  Denies pain today at eval   PRECAUTIONS: ,  None  RED FLAGS: None   WEIGHT BEARING RESTRICTIONS:  No  FALLS:  Has patient fallen in last 6 months? No   OCCUPATION:  retired  PLOF:  Independent with basic ADLs  PATIENT GOALS:  Become stronger and improve activity  OBJECTIVE:  Note: Objective measures were completed at Evaluation unless otherwise noted.    PATIENT SURVEYS:  Patient-Specific Activity Scoring Scheme  0 represents unable to perform. 10 represents able to perform at prior level. 0 1 2 3 4 5 6 7 8 9  10 (Date and Score)  Activity Eval     1. Taking off clothes and putting back on 7    2. Balance 7    3. Standing and turning 5   4.    5.    Score 6.33/10    Total score = sum of the activity scores/number of activities Minimum detectable change (90%CI) for average score = 2 points Minimum detectable change (90%CI) for single activity score = 3 points   GAIT: Eval Assistive device utilized: None Level of assistance: Complete Independence Comments: mild gait unsteadiness at times, shuffling gait and lack of arm swing   UPPER EXTREMITY MMT: Eval 4/5 MMT grossly bilat    LOWER EXTREMITY MMT:   Eval 4/5 MMT grossly bilat    FUNCTIONAL TESTS:  01/04/24 5 times sit to stand: 26.6 seconds, without UE support Timed up and go (TUG): 21.07 seconds without UE support  Berg Balance Scale:  Item Test date: 01/04/24 Date:  Date:   Sitting to standing 4. able to stand without using hands and stabilize independently Insert SmartPhrase OPRCBERGREEVAL Insert SmartPhrase OPRCBERGREEVAL  2. Standing unsupported 4. able to stand safely for 2 minutes     3. Sitting with back unsupported, feet supported 4. able to sit safely and securely for 2 minutes    4. Standing to sitting 3. controls descent by using hands    5. Pivot transfer  4. able to transfer safely with minor use of hands    6. Standing unsupported with eyes closed 3. able to stand 10 seconds with supervision    7. Standing unsupported with feet together 3. able to place feet together independently and stand 1 minute with supervision    8. Reaching forward with outstretched arms while standing 3. can reach forward 12 cm (5 inches)    9. Pick up object from the floor from standing 3. able to pick up slipper but needs supervision    10. Turning to look behind over left and right shoulders while standing 4. looks behind from both sides and weight shifts well    11. Turn 360 degrees 2. able to turn 360 degrees safely but slowly    12. Place alternate foot on step or stool while standing unsupported 3. able to stand independently and complete 8 steps in > 20 seconds     13. Standing unsupported one foot in front 2. able to take small step independently and hold 30 seconds    14. Standing on one leg 1. tries to lift leg unable to hold 3 seconds but remains standing independently.      Total Score 43/56 Total Score:    Total Score:  TREATMENT DATE:  02/01/24 TRUE machine Lvl 4 x 10 min total, interval training with increase in level and/or RPM periodically Standing PWR! Up, rock, twist, fwd and bwd step x6 each with two 1#; in sequence x5 Fwd walking like you're skiing with 1# working on big reciprocal arm swing 2x50' Bwd walking working on big steps back 2x50' Feet together on airex static standing eyes open x 30, head turns and head nods 2x30 each Feet apart on airex static standing eyes closed 2x 30, head turns and head nods x30 each Alternating toe  taps 4 step 2 x 30 sec  01/28/24 TRUE machine Lvl 4 to 7 x 10 min total, interval training with increase in level and/or RPM periodically Standing PWR! Up, rock, twist, fwd and bwd step x5 each with two 1#; in sequence x5 Fwd walking like you're skiing with 1# working on big reciprocal arm swing 2x50' Bwd walking working on big steps back 2x50' Feet together on airex static standing eyes open x 30, head turns and head nods 2x30 each Feet apart on airex static standing eyes closed 2x 30, head turns and head nods x30 each Feet together on firm surface eyes closed 2x30   01/26/24 TRUE machine Lvl 4 to 7 x 10 min total, interval training with increase in level and RPM  Standing PWR! Up, rock, twist, fwd and bwd step x10 each; in sequence x5 Feet together on airex static standing eyes open x 30, head turns and head nods 2x30 each   01/12/24 Nustep L5-8 x 10 min UEs/LEs PWR! Up, fwd step, twist, rock x10 each 1/2 tandem stance 3x30 R&L 3 way hip slider 2x10 R&L Fwd walking 2x60' large reciprocal arm swings and step lengths Backward walking 2x15'   PATIENT EDUCATION: Education details: HEP, PT plan of care, selfcare Person educated: Patient Education method: Explanation, Demonstration, Verbal cues, and Handouts Education comprehension: verbalized understanding, further education recommended   HOME EXERCISE PROGRAM: 01/28/24: Eyes closed feet together and backwards walking  Access Code: [email or web address] URL: [email or web address] Date: 01/04/2024 Prepared by: Redell Moose  Exercises - Sit to Stand with Arm Swing  - 2 x daily - 6 x weekly - 1 sets - 10 reps - Sit to Stand with Immediate Step  - 2 x daily - 6 x weekly - 1 sets - 10 reps - Standing Reach to Side with Weight Shift  - 2 x daily - 6 x weekly - 3 sets - 10 reps - Standing Reach to Opposite Side with Weight Shift  - 2 x daily - 6 x weekly - 1 sets - 10 reps - Staggered Stance Weight Shift with Arms Reaching   - 2 x daily - 6 x weekly - 1 sets - 10 reps - Step Forward with Arms Reaching to Sides  - 2 x daily - 6 x weekly - 3 sets - 10 reps - Step Sideways with Arms Reaching  - 2 x daily - 6 x weekly - 3 sets - 10 reps - Standing Tandem Balance with Counter Support  - 2 x daily - 6 x weekly - 1 sets - 3 reps - 30 hold  ASSESSMENT:  CLINICAL IMPRESSION:  Continued PWR! Moves. continued 1#  and added a repetition to each sequence for more endurance. Pt demonstrated improvements in reciprocal arm swing and was challenged by static and dynamic balanced task. Verbal cueing for loud counting and self-cueing (verbal) for initiation of movements. Overall patient tolerated session well stating that he felt  better post-session.  From eval: Patient is 64 y.o. year old male here with gradual onset since 2024 of progressive gait and balance difficulty, lightheadedness, dizziness, resting tremor, cogwheel rigidity, bradykinesia, with signs symptoms most consistent with idiopathic Parkinson disease.  Patient will benefit from skilled PT to improve overall function and to address impairments and limitations listed below.  OBJECTIVE IMPAIRMENTS: decreased activity tolerance for ADL's, difficulty walking, decreased balance, decreased endurance, decreased mobility, decreased ROM, decreased strength, impaired flexibility, impaired UE/LE use  ACTIVITY LIMITATIONS: bending, liftting, walking, standing, cleaning, community activity, driving, reaching, carry, dressing  PERSONAL FACTORS: see above PMH  also affecting patient's functional outcome.  REHAB POTENTIAL: Good  CLINICAL DECISION MAKING: Evolving/moderate complexity  EVALUATION COMPLEXITY: Moderate    GOALS: Short term PT Goals Target date: 02/01/2024    Pt will be I and compliant with HEP. Baseline:  Goal status: New  Pt will improve TUG time to less than 18 seconds to show improved gait speed and balance. Baseline: Goal status: New  Long term PT  goals Target date: 03/28/2024   Pt will improve TUG test to less than 15 seconds show improved gait speed, improved balance and decreased risk of falling Baseline: Goal status: New Pt will improve  strength to at least 4+/5 MMT to improve functional strength Baseline: Goal status: New Pt will improve Patient specific functional scale (PSFS) to at least /10 to show improved function level Baseline: Goal status: New Pt will improve BERG balance test to at least 47 points to show improved balance and decreased risk of falling Baseline: Goal status: New Pt will improve 5 times sit to stand test without UE support to less than 15 seconds to show improved leg strength and balance.  Baseline: Goal status: New  PLAN: PT FREQUENCY: 1-3 times per week   PT DURATION: 6-8 weeks  PLANNED INTERVENTIONS  97110-Therapeutic exercises, 97530- Therapeutic activity, [id number]- Neuromuscular re-education, 97535- Self Care, [id number]- Manual therapy, and 97116- Gait training  PLAN FOR NEXT SESSION: Check STGs. Review HEP and initiate PWR/BIG movements and gym activity for strength and endurance. Gait and balance NEXT MD VISIT: ?  Massie Ada, PT, DPT 02/01/2024, 11:43 AM  "

## 2024-02-04 ENCOUNTER — Encounter: Payer: Self-pay | Admitting: Physical Therapy

## 2024-02-04 ENCOUNTER — Ambulatory Visit: Admitting: Physical Therapy

## 2024-02-04 DIAGNOSIS — R2689 Other abnormalities of gait and mobility: Secondary | ICD-10-CM | POA: Diagnosis not present

## 2024-02-04 DIAGNOSIS — M6281 Muscle weakness (generalized): Secondary | ICD-10-CM

## 2024-02-04 NOTE — Therapy (Signed)
 " OUTPATIENT PHYSICAL THERAPY TREATMENT   Patient Name: Timothy Hahn MRN: [id number] DOB:Jun 13, 1960, 64 y.o., male Today's Date: 01/04/2024  END OF SESSION:  PT End of Session - 02/04/24 0804     Visit Number 6    Number of Visits 16    Date for Recertification  03/28/24    Authorization Type [employer]    PT Start Time 0750    PT Stop Time 0843    PT Time Calculation (min) 53 min    Activity Tolerance Patient tolerated treatment well    Behavior During Therapy [facility] for tasks assessed/performed           Past Medical History:  Diagnosis Date   Asthma    as a child only   Gout    History of kidney stones    Hypertension    Past Surgical History:  Procedure Laterality Date   ANAL FISSURE REPAIR  01/20/2000   COLONOSCOPY  10/31/2010   Dr.Jacobs 10/2020   LITHOTRIPSY  01/20/2003   kidney stones   TRANSURETHRAL RESECTION OF PROSTATE N/A 01/23/2021   Procedure: TRANSURETHRAL RESECTION OF THE PROSTATE (TURP);  Surgeon: [name], MD;  Location: [facility];  Service: Urology;  Laterality: N/A;   Patient Active Problem List   Diagnosis Date Noted   Enlarged prostate with urinary obstruction 01/23/2021    PCP: [name], MD   REFERRING PROVIDER: [name], MD   REFERRING DIAG: G20.A1 (ICD-10-CM) - Parkinson's disease without dyskinesia or fluctuating manifestation   Rationale for Evaluation and Treatment:  Rehabiliation  THERAPY DIAG:  Other abnormalities of gait and mobility - Plan: PT plan of care cert/re-cert  Muscle weakness (generalized) - Plan: PT plan of care cert/re-cert  ONSET DATE: Last 6 months   SUBJECTIVE:                                                                                                                                                                                           SUBJECTIVE STATEMENT:  Pt states his R knee has about 6/10 pain today.   From eval: He says for last 6 months he started  having difficulty with his movements, brain fog, balance, feels uncoordinated. He gets some tremors at times. He is on medication now. He does get sleepy a lot. He does have some dizzy spells and balance deficits but denies falls   PERTINENT HISTORY:  See above PMH  PAIN:  Denies pain today at eval   PRECAUTIONS: ,  None  RED FLAGS: None   WEIGHT BEARING RESTRICTIONS:  No  FALLS:  Has patient fallen in last 6  months? No   OCCUPATION:  retired  PLOF:  Independent with basic ADLs  PATIENT GOALS:  Become stronger and improve activity  OBJECTIVE:  Note: Objective measures were completed at Evaluation unless otherwise noted.    PATIENT SURVEYS:  Patient-Specific Activity Scoring Scheme  0 represents unable to perform. 10 represents able to perform at prior level. 0 1 2 3 4 5 6 7 8 9  10 (Date and Score)  Activity Eval     1. Taking off clothes and putting back on 7    2. Balance 7    3. Standing and turning 5   4.    5.    Score 6.33/10    Total score = sum of the activity scores/number of activities Minimum detectable change (90%CI) for average score = 2 points Minimum detectable change (90%CI) for single activity score = 3 points   GAIT: Eval Assistive device utilized: None Level of assistance: Complete Independence Comments: mild gait unsteadiness at times, shuffling gait and lack of arm swing   UPPER EXTREMITY MMT: Eval 4/5 MMT grossly bilat    LOWER EXTREMITY MMT:   Eval 4/5 MMT grossly bilat    FUNCTIONAL TESTS:  01/04/24 5 times sit to stand: 26.6 seconds, without UE support Timed up and go (TUG): 21.07 seconds without UE support  Berg Balance Scale:  Item Test date: 01/04/24 Date:  Date:   Sitting to standing 4. able to stand without using hands and stabilize independently Insert SmartPhrase OPRCBERGREEVAL Insert SmartPhrase OPRCBERGREEVAL  2. Standing unsupported 4. able to stand safely for 2 minutes    3. Sitting with back  unsupported, feet supported 4. able to sit safely and securely for 2 minutes    4. Standing to sitting 3. controls descent by using hands    5. Pivot transfer  4. able to transfer safely with minor use of hands    6. Standing unsupported with eyes closed 3. able to stand 10 seconds with supervision    7. Standing unsupported with feet together 3. able to place feet together independently and stand 1 minute with supervision    8. Reaching forward with outstretched arms while standing 3. can reach forward 12 cm (5 inches)    9. Pick up object from the floor from standing 3. able to pick up slipper but needs supervision    10. Turning to look behind over left and right shoulders while standing 4. looks behind from both sides and weight shifts well    11. Turn 360 degrees 2. able to turn 360 degrees safely but slowly    12. Place alternate foot on step or stool while standing unsupported 3. able to stand independently and complete 8 steps in > 20 seconds     13. Standing unsupported one foot in front 2. able to take small step independently and hold 30 seconds    14. Standing on one leg 1. tries to lift leg unable to hold 3 seconds but remains standing independently.      Total Score 43/56 Total Score:    Total Score:  TREATMENT DATE:  02/04/24 TRUE machine Lvl 4 x 12 min total, interval training with increase in level and/or RPM periodically Standing PWR! Up, rock, twist, fwd step, lateral step, and bwd step x 10 each Blaze pods 3 pods SLS 30 sec X 2 bilat (25 was top score) Blaze pods 4 pods square drill, walking multidireciton to turn off with legs Blaze pods 4 pods lateral shuffle 2 colors to add cognitive dual task March walking in bars without UE support 3 round trips Retrowalking in bars without UE support 3 round trips.    02/01/24 TRUE machine Lvl 4 x 10  min total, interval training with increase in level and/or RPM periodically Standing PWR! Up, rock, twist, fwd and bwd step x6 each with two 1#; in sequence x5 Fwd walking like you're skiing with 1# working on big reciprocal arm swing 2x50' Bwd walking working on big steps back 2x50' Feet together on airex static standing eyes open x 30, head turns and head nods 2x30 each Feet apart on airex static standing eyes closed 2x 30, head turns and head nods x30 each Alternating toe taps 4 step 2 x 30 sec  01/28/24 TRUE machine Lvl 4 to 7 x 10 min total, interval training with increase in level and/or RPM periodically Standing PWR! Up, rock, twist, fwd and bwd step x5 each with two 1#; in sequence x5 Fwd walking like you're skiing with 1# working on big reciprocal arm swing 2x50' Bwd walking working on big steps back 2x50' Feet together on airex static standing eyes open x 30, head turns and head nods 2x30 each Feet apart on airex static standing eyes closed 2x 30, head turns and head nods x30 each Feet together on firm surface eyes closed 2x30   01/26/24 TRUE machine Lvl 4 to 7 x 10 min total, interval training with increase in level and RPM  Standing PWR! Up, rock, twist, fwd and bwd step x10 each; in sequence x5 Feet together on airex static standing eyes open x 30, head turns and head nods 2x30 each   01/12/24 Nustep L5-8 x 10 min UEs/LEs PWR! Up, fwd step, twist, rock x10 each 1/2 tandem stance 3x30 R&L 3 way hip slider 2x10 R&L Fwd walking 2x60' large reciprocal arm swings and step lengths Backward walking 2x15'   PATIENT EDUCATION: Education details: HEP, PT plan of care, selfcare Person educated: Patient Education method: Explanation, Demonstration, Verbal cues, and Handouts Education comprehension: verbalized understanding, further education recommended   HOME EXERCISE PROGRAM: 01/28/24: Eyes closed feet together and backwards walking  Access Code:  [email or web address] URL: [email or web address] Date: 01/04/2024 Prepared by: Redell Moose  Exercises - Sit to Stand with Arm Swing  - 2 x daily - 6 x weekly - 1 sets - 10 reps - Sit to Stand with Immediate Step  - 2 x daily - 6 x weekly - 1 sets - 10 reps - Standing Reach to Side with Weight Shift  - 2 x daily - 6 x weekly - 3 sets - 10 reps - Standing Reach to Opposite Side with Weight Shift  - 2 x daily - 6 x weekly - 1 sets - 10 reps - Staggered Stance Weight Shift with Arms Reaching  - 2 x daily - 6 x weekly - 1 sets - 10 reps - Step Forward with Arms Reaching to Sides  - 2 x daily - 6 x weekly - 3 sets - 10 reps - Step Sideways with Arms Reaching  - 2 x  daily - 6 x weekly - 3 sets - 10 reps - Standing Tandem Balance with Counter Support  - 2 x daily - 6 x weekly - 1 sets - 3 reps - 30 hold  ASSESSMENT:  CLINICAL IMPRESSION:  Added in blaze pod activity for dual tasking to work on balance, coordination, scanning, cognition, and changing directions. Combined this with his big/power movements with supervision for balance.  Overall tolerated this well   From eval: Patient is 64 y.o. year old male here with gradual onset since 2024 of progressive gait and balance difficulty, lightheadedness, dizziness, resting tremor, cogwheel rigidity, bradykinesia, with signs symptoms most consistent with idiopathic Parkinson disease.  Patient will benefit from skilled PT to improve overall function and to address impairments and limitations listed below.  OBJECTIVE IMPAIRMENTS: decreased activity tolerance for ADL's, difficulty walking, decreased balance, decreased endurance, decreased mobility, decreased ROM, decreased strength, impaired flexibility, impaired UE/LE use  ACTIVITY LIMITATIONS: bending, liftting, walking, standing, cleaning, community activity, driving, reaching, carry, dressing  PERSONAL FACTORS: see above PMH  also affecting patient's functional outcome.  REHAB POTENTIAL:  Good  CLINICAL DECISION MAKING: Evolving/moderate complexity  EVALUATION COMPLEXITY: Moderate    GOALS: Short term PT Goals Target date: 02/01/2024    Pt will be I and compliant with HEP. Baseline:  Goal status: MET 02/04/24  Pt will improve TUG time to less than 18 seconds to show improved gait speed and balance. Baseline: Goal status: ongoing 02/04/24  Long term PT goals Target date: 03/28/2024   Pt will improve TUG test to less than 15 seconds show improved gait speed, improved balance and decreased risk of falling Baseline: Goal status: ongoing 02/04/24 Pt will improve  strength to at least 4+/5 MMT to improve functional strength Baseline: Goal status: ongoing 02/04/24 Pt will improve Patient specific functional scale (PSFS) to at least /10 to show improved function level Baseline: Goal status: ongoing 02/04/24 Pt will improve BERG balance test to at least 47 points to show improved balance and decreased risk of falling Baseline: Goal status: New Pt will improve 5 times sit to stand test without UE support to less than 15 seconds to show improved leg strength and balance.  Baseline: Goal status: ongoing 02/04/24  PLAN: PT FREQUENCY: 1-3 times per week   PT DURATION: 6-8 weeks  PLANNED INTERVENTIONS  97110-Therapeutic exercises, 97530- Therapeutic activity, 97112- Neuromuscular re-education, 97535- Self Care, 02859- Manual therapy, and 97116- Gait training  PLAN FOR NEXT SESSION: check TUG time.  PWR/BIG movements and gym activity for strength and endurance. Gait and balance NEXT MD VISIT: 02/08/24  Redell JONELLE Moose, PT, DPT 02/04/2024, 8:04 AM  "

## 2024-02-08 ENCOUNTER — Ambulatory Visit

## 2024-02-08 DIAGNOSIS — M6281 Muscle weakness (generalized): Secondary | ICD-10-CM

## 2024-02-08 DIAGNOSIS — R2689 Other abnormalities of gait and mobility: Secondary | ICD-10-CM

## 2024-02-08 NOTE — Therapy (Signed)
 " OUTPATIENT PHYSICAL THERAPY TREATMENT   Patient Name: Timothy Hahn MRN: [id number] DOB:1960/10/08, 64 y.o., male Today's Date: 01/04/2024  END OF SESSION:  PT End of Session - 02/08/24 0848     Visit Number 7    Number of Visits 16    Date for Recertification  03/28/24    Authorization Type [employer]    PT Start Time 0840    PT Stop Time 0920    PT Time Calculation (min) 40 min    Activity Tolerance Patient tolerated treatment well    Behavior During Therapy [facility] for tasks assessed/performed           Past Medical History:  Diagnosis Date   Asthma    as a child only   Gout    History of kidney stones    Hypertension    Past Surgical History:  Procedure Laterality Date   ANAL FISSURE REPAIR  01/20/2000   COLONOSCOPY  10/31/2010   Dr.Jacobs 10/2020   LITHOTRIPSY  01/20/2003   kidney stones   TRANSURETHRAL RESECTION OF PROSTATE N/A 01/23/2021   Procedure: TRANSURETHRAL RESECTION OF THE PROSTATE (TURP);  Surgeon: [name], MD;  Location: [facility];  Service: Urology;  Laterality: N/A;   Patient Active Problem List   Diagnosis Date Noted   Enlarged prostate with urinary obstruction 01/23/2021    PCP: [name], MD   REFERRING PROVIDER: [name], MD   REFERRING DIAG: G20.A1 (ICD-10-CM) - Parkinson's disease without dyskinesia or fluctuating manifestation   Rationale for Evaluation and Treatment:  Rehabiliation  THERAPY DIAG:  Other abnormalities of gait and mobility - Plan: PT plan of care cert/re-cert  Muscle weakness (generalized) - Plan: PT plan of care cert/re-cert  ONSET DATE: Last 6 months   SUBJECTIVE:                                                                                                                                                                                           SUBJECTIVE STATEMENT:  Pt reports a felling a little sluggish this morning and some L knee pain 6/10. He reports feeling  much better following warm up on true ride.  From eval: He says for last 6 months he started having difficulty with his movements, brain fog, balance, feels uncoordinated. He gets some tremors at times. He is on medication now. He does get sleepy a lot. He does have some dizzy spells and balance deficits but denies falls   PERTINENT HISTORY:  See above PMH  PAIN:  Denies pain today at eval   PRECAUTIONS: ,  None  RED FLAGS: None  WEIGHT BEARING RESTRICTIONS:  No  FALLS:  Has patient fallen in last 6 months? No   OCCUPATION:  retired  PLOF:  Independent with basic ADLs  PATIENT GOALS:  Become stronger and improve activity  OBJECTIVE:  Note: Objective measures were completed at Evaluation unless otherwise noted.    PATIENT SURVEYS:  Patient-Specific Activity Scoring Scheme  0 represents unable to perform. 10 represents able to perform at prior level. 0 1 2 3 4 5 6 7 8 9  10 (Date and Score)  Activity Eval     1. Taking off clothes and putting back on 7    2. Balance 7    3. Standing and turning 5   4.    5.    Score 6.33/10    Total score = sum of the activity scores/number of activities Minimum detectable change (90%CI) for average score = 2 points Minimum detectable change (90%CI) for single activity score = 3 points   GAIT: Eval Assistive device utilized: None Level of assistance: Complete Independence Comments: mild gait unsteadiness at times, shuffling gait and lack of arm swing   UPPER EXTREMITY MMT: Eval 4/5 MMT grossly bilat    LOWER EXTREMITY MMT:   Eval 4/5 MMT grossly bilat    FUNCTIONAL TESTS:  01/04/24 5 times sit to stand: 26.6 seconds, without UE support Timed up and go (TUG): 21.07 seconds without UE support  02/08/24 TUG: 8.7 sec without UE support  Berg Balance Scale:  Item Test date: 01/04/24 Date:  Date:   Sitting to standing 4. able to stand without using hands and stabilize independently Insert SmartPhrase  OPRCBERGREEVAL Insert SmartPhrase OPRCBERGREEVAL  2. Standing unsupported 4. able to stand safely for 2 minutes    3. Sitting with back unsupported, feet supported 4. able to sit safely and securely for 2 minutes    4. Standing to sitting 3. controls descent by using hands    5. Pivot transfer  4. able to transfer safely with minor use of hands    6. Standing unsupported with eyes closed 3. able to stand 10 seconds with supervision    7. Standing unsupported with feet together 3. able to place feet together independently and stand 1 minute with supervision    8. Reaching forward with outstretched arms while standing 3. can reach forward 12 cm (5 inches)    9. Pick up object from the floor from standing 3. able to pick up slipper but needs supervision    10. Turning to look behind over left and right shoulders while standing 4. looks behind from both sides and weight shifts well    11. Turn 360 degrees 2. able to turn 360 degrees safely but slowly    12. Place alternate foot on step or stool while standing unsupported 3. able to stand independently and complete 8 steps in > 20 seconds     13. Standing unsupported one foot in front 2. able to take small step independently and hold 30 seconds    14. Standing on one leg 1. tries to lift leg unable to hold 3 seconds but remains standing independently.      Total Score 43/56 Total Score:    Total Score:  TREATMENT DATE:   02/08/24 TRUE machine Lvl 4 x 10 min total, interval training with increase in level and/or RPM periodically Standing PWR! Up, rock, twist, fwd and bwd step with two 1#; in sequence x 10 reps Fwd walking like you're skiing with 1# working on big reciprocal arm swing 3x50' Bwd walking working on big steps back 3x50' Alternating toe taps 4 step 2 x 30 sec Marching on airex pad 2 x 30 sec Hurdles x 3  trials each FWD (2 feet), (1 foot), lateral L<>R  02/04/24 TRUE machine Lvl 4 x 12 min total, interval training with increase in level and/or RPM periodically Standing PWR! Up, rock, twist, fwd step, lateral step, and bwd step x 10 each Blaze pods 3 pods SLS 30 sec X 2 bilat (25 was top score) Blaze pods 4 pods square drill, walking multidireciton to turn off with legs Blaze pods 4 pods lateral shuffle 2 colors to add cognitive dual task March walking in bars without UE support 3 round trips Retrowalking in bars without UE support 3 round trips.    02/01/24 TRUE machine Lvl 4 x 10 min total, interval training with increase in level and/or RPM periodically Standing PWR! Up, rock, twist, fwd and bwd step x6 each with two 1#; in sequence x5 Fwd walking like you're skiing with 1# working on big reciprocal arm swing 2x50' Bwd walking working on big steps back 2x50' Feet together on airex static standing eyes open x 30, head turns and head nods 2x30 each Feet apart on airex static standing eyes closed 2x 30, head turns and head nods x30 each Alternating toe taps 4 step 2 x 30 sec  01/28/24 TRUE machine Lvl 4 to 7 x 10 min total, interval training with increase in level and/or RPM periodically Standing PWR! Up, rock, twist, fwd and bwd step x5 each with two 1#; in sequence x5 Fwd walking like you're skiing with 1# working on big reciprocal arm swing 2x50' Bwd walking working on big steps back 2x50' Feet together on airex static standing eyes open x 30, head turns and head nods 2x30 each Feet apart on airex static standing eyes closed 2x 30, head turns and head nods x30 each Feet together on firm surface eyes closed 2x30   01/26/24 TRUE machine Lvl 4 to 7 x 10 min total, interval training with increase in level and RPM  Standing PWR! Up, rock, twist, fwd and bwd step x10 each; in sequence x5 Feet together on airex static standing eyes open x 30, head turns and head nods 2x30  each   01/12/24 Nustep L5-8 x 10 min UEs/LEs PWR! Up, fwd step, twist, rock x10 each 1/2 tandem stance 3x30 R&L 3 way hip slider 2x10 R&L Fwd walking 2x60' large reciprocal arm swings and step lengths Backward walking 2x15'   PATIENT EDUCATION: Education details: HEP, PT plan of care, selfcare Person educated: Patient Education method: Explanation, Demonstration, Verbal cues, and Handouts Education comprehension: verbalized understanding, further education recommended   HOME EXERCISE PROGRAM: 01/28/24: Eyes closed feet together and backwards walking  Access Code: [email or web address] URL: [email or web address] Date: 01/04/2024 Prepared by: Redell Moose  Exercises - Sit to Stand with Arm Swing  - 2 x daily - 6 x weekly - 1 sets - 10 reps - Sit to Stand with Immediate Step  - 2 x daily - 6 x weekly - 1 sets - 10 reps - Standing Reach to Side with Weight Shift  - 2 x daily - 6  x weekly - 3 sets - 10 reps - Standing Reach to Opposite Side with Weight Shift  - 2 x daily - 6 x weekly - 1 sets - 10 reps - Staggered Stance Weight Shift with Arms Reaching  - 2 x daily - 6 x weekly - 1 sets - 10 reps - Step Forward with Arms Reaching to Sides  - 2 x daily - 6 x weekly - 3 sets - 10 reps - Step Sideways with Arms Reaching  - 2 x daily - 6 x weekly - 3 sets - 10 reps - Standing Tandem Balance with Counter Support  - 2 x daily - 6 x weekly - 1 sets - 3 reps - 30 hold  ASSESSMENT:  CLINICAL IMPRESSION:  The patient demonstrated improved endurance with exercise, along with improvements in gait speed, step length, and turning ability, as evidenced by an improved Timed Up and Go (TUG) score.The patient self-reports improved energy levels and satisfaction with physical therapy and will continue to benefit from skilled intervention.   From eval: Patient is 64 y.o. year old male here with gradual onset since 2024 of progressive gait and balance difficulty, lightheadedness, dizziness, resting  tremor, cogwheel rigidity, bradykinesia, with signs symptoms most consistent with idiopathic Parkinson disease.  Patient will benefit from skilled PT to improve overall function and to address impairments and limitations listed below.  OBJECTIVE IMPAIRMENTS: decreased activity tolerance for ADL's, difficulty walking, decreased balance, decreased endurance, decreased mobility, decreased ROM, decreased strength, impaired flexibility, impaired UE/LE use  ACTIVITY LIMITATIONS: bending, liftting, walking, standing, cleaning, community activity, driving, reaching, carry, dressing  PERSONAL FACTORS: see above PMH  also affecting patient's functional outcome.  REHAB POTENTIAL: Good  CLINICAL DECISION MAKING: Evolving/moderate complexity  EVALUATION COMPLEXITY: Moderate    GOALS: Short term PT Goals Target date: 02/01/2024    Pt will be I and compliant with HEP. Baseline:  Goal status: MET 02/04/24  Pt will improve TUG time to less than 18 seconds to show improved gait speed and balance. Baseline: Goal status: ongoing 02/04/24  Long term PT goals Target date: 03/28/2024   Pt will improve TUG test to less than 15 seconds show improved gait speed, improved balance and decreased risk of falling Baseline: Goal status: MET 02/08/24 Pt will improve  strength to at least 4+/5 MMT to improve functional strength Baseline: Goal status: ongoing 02/04/24 Pt will improve Patient specific functional scale (PSFS) to at least /10 to show improved function level Baseline: Goal status: ongoing 02/04/24 Pt will improve BERG balance test to at least 47 points to show improved balance and decreased risk of falling Baseline: Goal status: New Pt will improve 5 times sit to stand test without UE support to less than 15 seconds to show improved leg strength and balance.  Baseline: Goal status: ongoing 02/04/24  PLAN: PT FREQUENCY: 1-3 times per week   PT DURATION: 6-8 weeks  PLANNED INTERVENTIONS   97110-Therapeutic exercises, 97530- Therapeutic activity, 97112- Neuromuscular re-education, 97535- Self Care, [id number]- Manual therapy, and 97116- Gait training  PLAN FOR NEXT SESSION: PWR/BIG movements and gym activity for strength and endurance. Gait and balance NEXT MD VISIT: 02/08/24  Massie Ada, PT, DPT 02/08/2024, 9:37 AM  "

## 2024-02-10 ENCOUNTER — Encounter: Payer: Self-pay | Admitting: Diagnostic Neuroimaging

## 2024-02-10 ENCOUNTER — Ambulatory Visit: Admitting: Diagnostic Neuroimaging

## 2024-02-10 VITALS — BP 130/80 | HR 75 | Wt 191.0 lb

## 2024-02-10 DIAGNOSIS — G20A1 Parkinson's disease without dyskinesia, without mention of fluctuations: Secondary | ICD-10-CM

## 2024-02-10 MED ORDER — CARBIDOPA-LEVODOPA 25-100 MG PO TABS: 1.0000 | ORAL_TABLET | Freq: Three times a day (TID) | ORAL | 4 refills | Status: AC

## 2024-02-10 NOTE — Progress Notes (Signed)
 "  [facility]  PATIENT: Timothy Hahn DOB: Apr 22, 1960  REFERRING CLINICIAN: [name], MD HISTORY FROM: patient and wife REASON FOR VISIT: new consult   HISTORICAL  CHIEF COMPLAINT:  Chief Complaint  Patient presents with   RM 7    Patient is here Parkinson's disease - has been going to physical therapy at [facility]  and has been working out 3 days a week     HISTORY OF PRESENT ILLNESS:   UPDATE (02/10/24, VRP): Since last visit, doing well! Now on carb/levo with great results. Also getting PT at [facility] 3x per week and making improvements.   PRIOR HPI (12/09/23, VRP): 64 year old male here for evaluation of tremor, dizziness, gait difficulty.  Since 2024 patient has had gradual onset progressive intermittent lightheadedness, dizziness when he stands up.  Also has noted tremor in his hands, decreased appetite, gait and balance difficulty.  He feels like he moves in slow motion.  His steps have become shorter and more unsteady.  Difficulty with turning.  Speech is soft and hoarse.  Sometimes he has to cough to clear his throat frequently.  Has had some blood pressure fluctuation difficulties.   REVIEW OF SYSTEMS: Full 14 system review of systems performed and negative with exception of: as per HPI.  ALLERGIES: Allergies  Allergen Reactions   Brazil Nut (Berthollefia Excelsa) Anaphylaxis   Bactrim [Sulfamethoxazole-Trimethoprim] Nausea Only   Benazepril Hcl Swelling    angioedema   Lisinopril Swelling    HOME MEDICATIONS: Outpatient Medications Prior to Visit  Medication Sig Dispense Refill   acetaminophen  (TYLENOL ) 500 MG tablet Take 500 mg by mouth every 6 (six) hours as needed. Only as needed hasnt taken in awhile 01/16/2021     allopurinol  (ZYLOPRIM ) 300 MG tablet Take 300 mg by mouth daily.     allopurinol  (ZYLOPRIM ) 300 MG tablet 1 tablet     amLODipine (NORVASC) 5 MG tablet Take 5 mg by mouth daily.     carvedilol  (COREG ) 25 MG tablet  Take 25 mg by mouth 2 (two) times daily.     Cholecalciferol (VITAMIN D3 PO) Take by mouth. (Patient taking differently: Take 1 capsule by mouth daily in the afternoon. 5000mcg 1 capsule daily)     Cyanocobalamin (VITAMIN B-12 PO) Take by mouth.     hydrALAZINE  (APRESOLINE ) 25 MG tablet Take 25 mg by mouth 2 (two) times daily.     carbidopa -levodopa  (SINEMET  IR) 25-100 MG tablet Take 1 tablet by mouth 3 (three) times daily before meals. 90 tablet 6   oxybutynin  (DITROPAN ) 5 MG tablet Take 1 tablet (5 mg total) by mouth every 8 (eight) hours as needed for up to 15 doses for bladder spasms. (Patient not taking: Reported on 02/10/2024) 15 tablet 1   No facility-administered medications prior to visit.    PAST MEDICAL HISTORY: Past Medical History:  Diagnosis Date   Asthma    as a child only   Gout    History of kidney stones    Hypertension     PAST SURGICAL HISTORY: Past Surgical History:  Procedure Laterality Date   ANAL FISSURE REPAIR  01/20/2000   COLONOSCOPY  10/31/2010   Dr.Jacobs 10/2020   LITHOTRIPSY  01/20/2003   kidney stones   TRANSURETHRAL RESECTION OF PROSTATE N/A 01/23/2021   Procedure: TRANSURETHRAL RESECTION OF THE PROSTATE (TURP);  Surgeon: [name], MD;  Location: [facility];  Service: Urology;  Laterality: N/A;    FAMILY HISTORY: Family History  Problem Relation Age  of Onset   Multiple myeloma Mother    ALS Sister    Colon cancer Neg Hx    Stomach cancer Neg Hx    Rectal cancer Neg Hx    Esophageal cancer Neg Hx    Colon polyps Neg Hx     SOCIAL HISTORY: Social History   Socioeconomic History   Marital status: Married    Spouse name: Not on file   Number of children: Not on file   Years of education: Not on file   Highest education level: Not on file  Occupational History   Not on file  Tobacco Use   Smoking status: Never   Smokeless tobacco: Never  Vaping Use   Vaping status: Never Used  Substance and Sexual Activity    Alcohol use: No   Drug use: No   Sexual activity: Not on file  Other Topics Concern   Not on file  Social History Narrative   1 cup of coffee weekly    Social Drivers of Health   Tobacco Use: Low Risk (02/10/2024)   Patient History    Smoking Tobacco Use: Never    Smokeless Tobacco Use: Never    Passive Exposure: Not on file  Financial Resource Strain: Low Risk  (08/09/2023)   Received from [facility]   Overall Financial Resource Strain (CARDIA)    Difficulty of Paying Living Expenses: Not hard at all  Food Insecurity: No Food Insecurity (08/09/2023)   Received from [facility]   Epic    Within the past 12 months, you worried that your food would run out before you got the money to buy more.: Never true    Within the past 12 months, the food you bought just didn't last and you didn't have money to get more.: Never true  Transportation Needs: No Transportation Needs (08/09/2023)   Received from [facility] - Transportation    In the past 12 months, has lack of transportation kept you from medical appointments or from getting medications?: No    Lack of Transportation (Non-Medical): No  Physical Activity: Not on file  Stress: Not on file  Social Connections: Not on file  Intimate Partner Violence: Not on file  Depression (EYV7-0): Not on file  Alcohol Screen: Not on file  Housing: Low Risk  (08/09/2023)   Received from [facility]   Epic    In the last 12 months, was there a time when you were not able to pay the mortgage or rent on time?: No    In the past 12 months, how many times have you moved where you were living?: 0    At any time in the past 12 months, were you homeless or living in a shelter (including now)?: No  Utilities: Not At Risk (08/09/2023)   Received from [facility]   Epic    In the past 12 months has the electric, gas, oil, or water company threatened to shut  off services in your home?: No  Health Literacy: Not on file     PHYSICAL EXAM  GENERAL EXAM/CONSTITUTIONAL: Vitals:  Vitals:   02/10/24 1528  BP: 130/80  Pulse: 75  SpO2: 99%  Weight: 191 lb (86.6 kg)   Body mass index is 25.9 kg/m. Wt Readings from Last 3 Encounters:  02/10/24 191 lb (86.6 kg)  12/09/23 190 lb (86.2 kg)  08/09/23 205 lb (93 kg)   Patient is in  no distress; well developed, nourished and groomed; neck is supple  CARDIOVASCULAR: Examination of carotid arteries is normal; no carotid bruits Regular rate and rhythm, no murmurs Examination of peripheral vascular system by observation and palpation is normal  EYES: Ophthalmoscopic exam of optic discs and posterior segments is normal; no papilledema or hemorrhages No results found.  MUSCULOSKELETAL: Gait, strength, tone, movements noted in Neurologic exam below  NEUROLOGIC: MENTAL STATUS:      No data to display         awake, alert, oriented to person, place and time recent and remote memory intact normal attention and concentration language fluent, comprehension intact, naming intact fund of knowledge appropriate  CRANIAL NERVE:  2nd - no papilledema on fundoscopic exam 2nd, 3rd, 4th, 6th - pupils equal and reactive to light, visual fields full to confrontation, extraocular muscles intact, no nystagmus 5th - facial sensation symmetric 7th - facial strength symmetric 8th - hearing intact 9th - palate elevates symmetrically, uvula midline 11th - shoulder shrug symmetric 12th - tongue protrusion midline MILD MASKED FACIES MILD SOFT VOICE  MOTOR:  NO RIGIDITY, NO BRADYKINESIA; NO TREMOR normal bulk and tone, full strength in the BUE, BLE  SENSORY:  normal and symmetric to light touch, temperature, vibration  COORDINATION:  finger-nose-finger, fine finger movements normal  REFLEXES:  deep tendon reflexes 1+and symmetric  GAIT/STATION:  narrow based gait; SLIGHTLY DECR ARM  SWING     DIAGNOSTIC DATA (LABS, IMAGING, TESTING) - I reviewed patient records, labs, notes, testing and imaging myself where available.  Lab Results  Component Value Date   WBC 3.6 (L) 08/09/2023   HGB 12.4 (L) 08/09/2023   HCT 37.6 (L) 08/09/2023   MCV 92.6 08/09/2023   PLT 161 08/09/2023      Component Value Date/Time   NA 142 08/09/2023 1118   K 4.1 08/09/2023 1118   CL 107 08/09/2023 1118   CO2 25 08/09/2023 1118   GLUCOSE 91 08/09/2023 1118   BUN 15 08/09/2023 1118   CREATININE 1.16 08/09/2023 1118   CALCIUM 9.3 08/09/2023 1118   PROT 7.2 08/09/2023 1118   ALBUMIN 3.6 08/09/2023 1118   AST 19 08/09/2023 1118   ALT 9 08/09/2023 1118   ALKPHOS 50 08/09/2023 1118   BILITOT 0.8 08/09/2023 1118   GFRNONAA >60 08/09/2023 1118   No results found for: CHOL, HDL, LDLCALC, LDLDIRECT, TRIG, CHOLHDL No results found for: YHAJ8R No results found for: VITAMINB12 No results found for: TSH   08/20/23 MRI brain [I reviewed images myself and agree with interpretation. -VRP]  1.  No evidence of an acute intracranial abnormality. 2. Chronic small vessel ischemic changes within the cerebral white matter, moderate for age. 3. Mild generalized cerebral atrophy. 4. Paranasal sinus disease as described. 5. Small left mastoid effusion.     ASSESSMENT AND PLAN  64 y.o. year old male here with gradual onset since 2024 of progressive gait and balance difficulty, lightheadedness, dizziness, resting tremor, cogwheel rigidity, bradykinesia, masked facies with signs symptoms most consistent with idiopathic Parkinson disease.   Dx:  1. Parkinson's disease without dyskinesia or fluctuating manifestations (HCC)     PLAN:  Idiopathic Parkinson disease (since 2024)  - reviewed diagnosis, prognosis and treatment options; emphasize importance of exercise, fall precautions, safety considerations; ok to return to driving short distances with some supervision  initially  - continue carbidopa  / levodopa  (25/100) 1 tab three times a day (~30-60 minutes before meals)   Return in about 6 months (around 08/09/2024).  EDUARD FABIENE HANLON, MD 02/10/2024, 4:16 PM Certified in Neurology, Neurophysiology and Neuroimaging  [facility] [address] [place], [place] [address] [phone]  "

## 2024-02-10 NOTE — Patient Instructions (Signed)
 Idiopathic Parkinson disease (since 2024)  - ok to return to driving short distances with some supervision initially  - continue carbidopa  / levodopa  (25/100) 1 tab three times a day (~30-60 minutes before meals)

## 2024-02-16 ENCOUNTER — Ambulatory Visit: Admitting: Physical Therapy

## 2024-02-16 ENCOUNTER — Telehealth: Payer: Self-pay | Admitting: Diagnostic Neuroimaging

## 2024-02-16 NOTE — Telephone Encounter (Signed)
 Brandi from [facility] office  called to get clearance for Pt . They need to know if  pt can take  Amoxicillin with Carbidopa  / Levodopa  . Pt has a abscess in his mouth   Callback number is [phone] Dr. Nada office wants to make sure   Amoxicillin  will not affect any of Pt medication

## 2024-02-17 ENCOUNTER — Ambulatory Visit (HOSPITAL_COMMUNITY): Attending: Diagnostic Neuroimaging | Admitting: Speech Pathology

## 2024-02-17 ENCOUNTER — Encounter (HOSPITAL_COMMUNITY): Payer: Self-pay | Admitting: Speech Pathology

## 2024-02-17 ENCOUNTER — Other Ambulatory Visit: Payer: Self-pay

## 2024-02-17 DIAGNOSIS — R41841 Cognitive communication deficit: Secondary | ICD-10-CM | POA: Diagnosis present

## 2024-02-17 DIAGNOSIS — R471 Dysarthria and anarthria: Secondary | ICD-10-CM | POA: Diagnosis present

## 2024-02-17 DIAGNOSIS — G20A1 Parkinson's disease without dyskinesia, without mention of fluctuations: Secondary | ICD-10-CM | POA: Diagnosis not present

## 2024-02-17 NOTE — Therapy (Signed)
 " OUTPATIENT SPEECH LANGUAGE PATHOLOGY PARKINSON'S EVALUATION   Patient Name: Timothy Hahn MRN: [id number] DOB:[birth year], 64 y.o., male Today's Date: 02/17/2024  PCP: [name], MD REFERRING PROVIDER: [name], MD  END OF SESSION:  End of Session - 02/17/24 0921     Visit Number 1    Number of Visits 9    Date for Recertification  03/23/24    Authorization Type [employer] PPO   DED 1,500 met  FM DED 3,000 (1,500 met)  OOP 4,000 (2,308.88 met)  Fm OOP 8,000 (2,308.88 met)  Co-ins 20%  Visit limit 30  [facility] Portal   SLP Start Time 0900    SLP Stop Time  0945    SLP Time Calculation (min) 45 min    Activity Tolerance Patient tolerated treatment well          Past Medical History:  Diagnosis Date   Asthma    as a child only   Gout    History of kidney stones    Hypertension    Past Surgical History:  Procedure Laterality Date   ANAL FISSURE REPAIR  01/20/2000   COLONOSCOPY  10/31/2010   Dr.Jacobs 10/2020   LITHOTRIPSY  01/20/2003   kidney stones   TRANSURETHRAL RESECTION OF PROSTATE N/A 01/23/2021   Procedure: TRANSURETHRAL RESECTION OF THE PROSTATE (TURP);  Surgeon: [name], MD;  Location: [facility];  Service: Urology;  Laterality: N/A;   Patient Active Problem List   Diagnosis Date Noted   Enlarged prostate with urinary obstruction 01/23/2021    ONSET DATE: ~11/2023  REFERRING DIAG: G20.A1 (ICD-10-CM) - Parkinson's disease without dyskinesia or fluctuating manifestations (HCC)  THERAPY DIAG:  Dysarthria and anarthria  Cognitive communication deficit  Rationale for Evaluation and Treatment: Rehabilitation  SUBJECTIVE:   SUBJECTIVE STATEMENT: My wife says she can't hear me.  Pt accompanied by: self  PERTINENT HISTORY: 64 y.o. year old male with gradual onset since 2024 of progressive gait and balance difficulty, lightheadedness, dizziness, resting tremor, cogwheel rigidity, bradykinesia, masked facies with  signs symptoms most consistent with idiopathic Parkinson disease. He was referred for SLP evaluation and treatment by his neurologist, Dr. Eduard Margaret. He is receiving outpatient PT (PWR/BIG) at [facility] in [place], [place].   PAIN:  Are you having pain? No  FALLS: Has patient fallen in last 6 months?  No  LIVING ENVIRONMENT: Lives with: lives with their spouse Lives in: House/apartment  PLOF:  Level of assistance: Independent with ADLs, Independent with IADLs Employment: Retired  PATIENT GOALS: Improve vocal quality  OBJECTIVE:  Note: Objective measures were completed at Evaluation unless otherwise noted.  DIAGNOSTIC FINDINGS:  MRI BRAIN IMPRESSION: 1.  No evidence of an acute intracranial abnormality. 2. Chronic small vessel ischemic changes within the cerebral white matter, moderate for age. 3. Mild generalized cerebral atrophy. 4. Paranasal sinus disease as described. 5. Small left mastoid effusion.   Electronically Signed: By: Rockey Childs D.O. On: 08/24/2023 10:41  COGNITION: Overall cognitive status: Within functional limits for tasks assessed Areas of impairment: Attention Comments: Pt reports a shorter attention span and remembering certain details.  MOTOR SPEECH: Overall motor speech: impaired Level of impairment: Sentence Respiration: diaphragmatic/abdominal breathing Phonation: normal, hoarse, low vocal intensity, and mild Resonance: WFL Articulation: Appears intact Intelligibility: Intelligible Motor planning: Appears intact Motor speech errors: aware and unaware Interfering components: n/a Effective technique: increased vocal intensity  ORAL MOTOR EXAMINATION: Overall status: WFL Comments: masked face  OBJECTIVE VOICE ASSESSMENT: Sustained ah maximum phonation time:  23 seconds Sustained ah loudness average: 84 dB Oral reading (passage) loudness average: 64 dB Oral reading loudness range: 57-68 dB Conversational loudness average: 62  dB Conversational loudness range: 56-67 dB Voice quality: low vocal intensity and mild hoarseness quality Stimulability trials: Given SLP modeling and usual min cues, loudness average increased to 74dB (range of 58 to 78) in oral counting 1-10. Comments: Pt cued to speak OUT and speak with intent  Completed audio recording of patients baseline voice without cueing from SLP: Yes  Pt does report difficulty with swallowing which does warrant further evaluation.  [facility] SLUMS Examination Orientation  3/3  Numeric Problem Solving  3/3  Memory  5/5  Attention 1/2  Thought Organization 3/3  Clock Drawing 4/4  Visuospatial Skills               2/2  Short Story Recall  4/8  Total  25/30     Scoring  High School Education  Less than High School Education   Normal  27-30 25-30  Mild Neurocognitive Disorder 21-26 20-24  Dementia  1-20 1-19                                                                                                                           TREATMENT DATE: 02/17/24  PATIENT EDUCATION: Education details: Plan for SPEAKOUT! Therapy and other methods to increase vocal clarity and intensity Person educated: Patient Education method: Explanation, Demonstration, and Handouts Education comprehension: verbalized understanding  HOME EXERCISE PROGRAM: Pt will complete HEP as assigned to facilitate carryover of treatment strategies and techniques in home and community environment with written cues.  GOALS: Goals reviewed with patient? Yes  SHORT TERM GOALS: Target date: 03/23/2024  Pt will coordinate vocal and articulatory subsystems in hierarchical speech tasks by producing sounds with intention with min assistance.   Baseline: Mi/mod assist Goal status: INITIAL  2. Generalize intentional speech to cognitive-linguistic exercises and conversational speech with improved vocal quality, loudness, articulatory precision, and endurance while maintaining a minimum of 85 dB with min  assistance. Baseline: 62 dB Goal status: INITIAL  3.  Read phrases, sentences, and paragraphs with intention, yielding improved vocal quality, loudness, articulatory precision, and endurance while maintaining a minimum of 85 dB with min assistance.  Baseline: 64 dB Goal status: INITIAL  4.  Patient will maintain a maximum phonation duration of 10+ seconds or greater with minimal cues during sustained phonation over 10 trials. Baseline: 23 seconds x 1 trial Goal status: INITIAL  5.  The patient will increase loudness in conversational speech allowing them to converse in the community, increase voice use, slow progression of vocal deterioration, and improve their QOL with min cues from caregiver Baseline: mi/mod Goal status: INITIAL  LONG TERM GOALS: Target date: 04/19/2024  Pt will utilize intent across speech tasks in a variety of environments with indirect cues as needed. Baseline: Introduced this date Goal status: INITIAL  ASSESSMENT:  CLINICAL IMPRESSION: Patient is a 64 y.o. male  who was seen today for SLP evaluation in setting of Parkinson's Disease. He presents with mild hypokinetic dysarthria characterized by reduced vocal intensity, mild breathy/hoarse quality, and reduced ability to sustain breath support for lengthy conversations. Pt reports mild brain fog and difficulty remembering dates. He scored a 25/30 on the [facility] with deficits in attention (digits in reverse) and story recall. Recommend skilled SLP therapy to address hypokinetic dysarthria in the goals stated above. Pt is motivated to come to therapy and has excellent family support.   OBJECTIVE IMPAIRMENTS: Objective impairments include attention, dysarthria, and dysphagia. These impairments are limiting patient from effectively communicating at home and in community and safety when swallowing.Factors affecting potential to achieve goals and functional outcome are none. Patient will benefit from skilled SLP services to  address above impairments and improve overall function.  REHAB POTENTIAL: Excellent  PLAN:  SLP FREQUENCY: 2x/week  SLP DURATION: 4 weeks  PLANNED INTERVENTIONS: Cueing hierachy, Internal/external aids, Functional tasks, Multimodal communication approach, SLP instruction and feedback, Compensatory strategies, Patient/family education, [phone] Treatment of speech (30 or 45 min) , and [phone], Artic, Phon, Eval Compre, Express    Deondrick Searls, CCC-SLP 02/17/2024, 10:29 AM      "

## 2024-02-17 NOTE — Telephone Encounter (Signed)
 Medical Clearance Letter has been signed and faxed to Dr. [facility] office for patient documentation.

## 2024-02-17 NOTE — Telephone Encounter (Signed)
 I called [facility]. [facility] office and spoke to [facility] to inform them that the patient has been medically cleared to take amoxicillin while on Carb/Levo and noted from Dr. Chancy previous note.   They requested a letter to document clearance. Letter is ready to be signed and faxed over to their office.  Fax number: [phone]

## 2024-02-18 ENCOUNTER — Encounter: Payer: Self-pay | Admitting: Physical Therapy

## 2024-02-18 ENCOUNTER — Ambulatory Visit: Admitting: Physical Therapy

## 2024-02-18 DIAGNOSIS — R2689 Other abnormalities of gait and mobility: Secondary | ICD-10-CM | POA: Diagnosis not present

## 2024-02-18 DIAGNOSIS — M6281 Muscle weakness (generalized): Secondary | ICD-10-CM

## 2024-02-18 NOTE — Therapy (Signed)
 " OUTPATIENT PHYSICAL THERAPY TREATMENT   Patient Name: Timothy Hahn MRN: [id number] DOB:12/28/1960, 64 y.o., male Today's Date: 01/04/2024  END OF SESSION:  PT End of Session - 02/18/24 0948     Visit Number 8    Number of Visits 16    Date for Recertification  03/28/24    Authorization Type UHC    PT Start Time 0845    PT Stop Time 0930    PT Time Calculation (min) 45 min    Activity Tolerance Patient tolerated treatment well    Behavior During Therapy [facility] for tasks assessed/performed            Past Medical History:  Diagnosis Date   Asthma    as a child only   Gout    History of kidney stones    Hypertension    Past Surgical History:  Procedure Laterality Date   ANAL FISSURE REPAIR  01/20/2000   COLONOSCOPY  10/31/2010   Dr.Jacobs 10/2020   LITHOTRIPSY  01/20/2003   kidney stones   TRANSURETHRAL RESECTION OF PROSTATE N/A 01/23/2021   Procedure: TRANSURETHRAL RESECTION OF THE PROSTATE (TURP);  Surgeon: [name], MD;  Location: [facility];  Service: Urology;  Laterality: N/A;   Patient Active Problem List   Diagnosis Date Noted   Enlarged prostate with urinary obstruction 01/23/2021    PCP: [name], MD   REFERRING PROVIDER: [name], MD   REFERRING DIAG: G20.A1 (ICD-10-CM) - Parkinson's disease without dyskinesia or fluctuating manifestation   Rationale for Evaluation and Treatment:  Rehabiliation  THERAPY DIAG:  Other abnormalities of gait and mobility - Plan: PT plan of care cert/re-cert  Muscle weakness (generalized) - Plan: PT plan of care cert/re-cert  ONSET DATE: Last 6 months   SUBJECTIVE:                                                                                                                                                                                           SUBJECTIVE STATEMENT:  Pt reports good report from doctor, he denies pain but does have some tightness in his leg  From  eval: He says for last 6 months he started having difficulty with his movements, brain fog, balance, feels uncoordinated. He gets some tremors at times. He is on medication now. He does get sleepy a lot. He does have some dizzy spells and balance deficits but denies falls   PERTINENT HISTORY:  See above PMH  PAIN:  Denies pain today at eval   PRECAUTIONS: ,  None  RED FLAGS: None   WEIGHT BEARING RESTRICTIONS:  No  FALLS:  Has patient fallen in last 6 months? No   OCCUPATION:  retired  PLOF:  Independent with basic ADLs  PATIENT GOALS:  Become stronger and improve activity  OBJECTIVE:  Note: Objective measures were completed at Evaluation unless otherwise noted.    PATIENT SURVEYS:  Patient-Specific Activity Scoring Scheme  0 represents unable to perform. 10 represents able to perform at prior level. 0 1 2 3 4 5 6 7 8 9  10 (Date and Score)  Activity Eval     1. Taking off clothes and putting back on 7    2. Balance 7    3. Standing and turning 5   4.    5.    Score 6.33/10    Total score = sum of the activity scores/number of activities Minimum detectable change (90%CI) for average score = 2 points Minimum detectable change (90%CI) for single activity score = 3 points   GAIT: Eval Assistive device utilized: None Level of assistance: Complete Independence Comments: mild gait unsteadiness at times, shuffling gait and lack of arm swing   UPPER EXTREMITY MMT: Eval 4/5 MMT grossly bilat    LOWER EXTREMITY MMT:   Eval 4/5 MMT grossly bilat    FUNCTIONAL TESTS:  01/04/24 5 times sit to stand: 26.6 seconds, without UE support Timed up and go (TUG): 21.07 seconds without UE support  02/08/24 TUG: 8.7 sec without UE support  Berg Balance Scale:  Item Test date: 01/04/24 Date:  Date:   Sitting to standing 4. able to stand without using hands and stabilize independently Insert SmartPhrase OPRCBERGREEVAL Insert SmartPhrase OPRCBERGREEVAL   2. Standing unsupported 4. able to stand safely for 2 minutes    3. Sitting with back unsupported, feet supported 4. able to sit safely and securely for 2 minutes    4. Standing to sitting 3. controls descent by using hands    5. Pivot transfer  4. able to transfer safely with minor use of hands    6. Standing unsupported with eyes closed 3. able to stand 10 seconds with supervision    7. Standing unsupported with feet together 3. able to place feet together independently and stand 1 minute with supervision    8. Reaching forward with outstretched arms while standing 3. can reach forward 12 cm (5 inches)    9. Pick up object from the floor from standing 3. able to pick up slipper but needs supervision    10. Turning to look behind over left and right shoulders while standing 4. looks behind from both sides and weight shifts well    11. Turn 360 degrees 2. able to turn 360 degrees safely but slowly    12. Place alternate foot on step or stool while standing unsupported 3. able to stand independently and complete 8 steps in > 20 seconds     13. Standing unsupported one foot in front 2. able to take small step independently and hold 30 seconds    14. Standing on one leg 1. tries to lift leg unable to hold 3 seconds but remains standing independently.      Total Score 43/56 Total Score:    Total Score:  TREATMENT DATE:  02/18/24 Bike X 10 min Standing PWR! Up, rock, twist, fwd and bwd step  10 reps each Alternating toe taps 4 step 2 x 30 sec Stairs reciprocal without UE support, 5 trips over and back March walking 75 feet X 2 with CGA Backwards walking 75 feet X 2 with CGA Blaze pods 3 pods SLS 30 sec X 2 bilat (24 was top score) Blaze pods 4 pods square drill, walking multidireciton to turn off with legs and arms  02/08/24 TRUE machine Lvl 4 x 10 min total,  interval training with increase in level and/or RPM periodically Standing PWR! Up, rock, twist, fwd and bwd step with two 1#; in sequence x 10 reps Fwd walking like you're skiing with 1# working on big reciprocal arm swing 3x50' Bwd walking working on big steps back 3x50' Alternating toe taps 4 step 2 x 30 sec Marching on airex pad 2 x 30 sec Hurdles x 3 trials each FWD (2 feet), (1 foot), lateral L<>R  02/04/24 TRUE machine Lvl 4 x 12 min total, interval training with increase in level and/or RPM periodically Standing PWR! Up, rock, twist, fwd step, lateral step, and bwd step x 10 each Blaze pods 3 pods SLS 30 sec X 2 bilat (25 was top score) Blaze pods 4 pods square drill, walking multidireciton to turn off with legs Blaze pods 4 pods lateral shuffle 2 colors to add cognitive dual task March walking in bars without UE support 3 round trips Retrowalking in bars without UE support 3 round trips.    02/01/24 TRUE machine Lvl 4 x 10 min total, interval training with increase in level and/or RPM periodically Standing PWR! Up, rock, twist, fwd and bwd step x6 each with two 1#; in sequence x5 Fwd walking like you're skiing with 1# working on big reciprocal arm swing 2x50' Bwd walking working on big steps back 2x50' Feet together on airex static standing eyes open x 30, head turns and head nods 2x30 each Feet apart on airex static standing eyes closed 2x 30, head turns and head nods x30 each Alternating toe taps 4 step 2 x 30 sec  01/28/24 TRUE machine Lvl 4 to 7 x 10 min total, interval training with increase in level and/or RPM periodically Standing PWR! Up, rock, twist, fwd and bwd step x5 each with two 1#; in sequence x5 Fwd walking like you're skiing with 1# working on big reciprocal arm swing 2x50' Bwd walking working on big steps back 2x50' Feet together on airex static standing eyes open x 30, head turns and head nods 2x30 each Feet apart on airex static standing eyes  closed 2x 30, head turns and head nods x30 each Feet together on firm surface eyes closed 2x30   01/26/24 TRUE machine Lvl 4 to 7 x 10 min total, interval training with increase in level and RPM  Standing PWR! Up, rock, twist, fwd and bwd step x10 each; in sequence x5 Feet together on airex static standing eyes open x 30, head turns and head nods 2x30 each   01/12/24 Nustep L5-8 x 10 min UEs/LEs PWR! Up, fwd step, twist, rock x10 each 1/2 tandem stance 3x30 R&L 3 way hip slider 2x10 R&L Fwd walking 2x60' large reciprocal arm swings and step lengths Backward walking 2x15'   PATIENT EDUCATION: Education details: HEP, PT plan of care, selfcare Person educated: Patient Education method: Explanation, Demonstration, Verbal cues, and Handouts Education comprehension: verbalized understanding, further education recommended   HOME EXERCISE  PROGRAM: 01/28/24: Eyes closed feet together and backwards walking  Access Code: [email or web address] URL: [email or web address] Date: 01/04/2024 Prepared by: Redell Moose  Exercises - Sit to Stand with Arm Swing  - 2 x daily - 6 x weekly - 1 sets - 10 reps - Sit to Stand with Immediate Step  - 2 x daily - 6 x weekly - 1 sets - 10 reps - Standing Reach to Side with Weight Shift  - 2 x daily - 6 x weekly - 3 sets - 10 reps - Standing Reach to Opposite Side with Weight Shift  - 2 x daily - 6 x weekly - 1 sets - 10 reps - Staggered Stance Weight Shift with Arms Reaching  - 2 x daily - 6 x weekly - 1 sets - 10 reps - Step Forward with Arms Reaching to Sides  - 2 x daily - 6 x weekly - 3 sets - 10 reps - Step Sideways with Arms Reaching  - 2 x daily - 6 x weekly - 3 sets - 10 reps - Standing Tandem Balance with Counter Support  - 2 x daily - 6 x weekly - 1 sets - 3 reps - 30 hold  ASSESSMENT:  CLINICAL IMPRESSION:  PT continues to work on movement quality, strength and balance to improve gait and abilities for ADL's.   From eval: Patient is  64 y.o. year old male here with gradual onset since 2024 of progressive gait and balance difficulty, lightheadedness, dizziness, resting tremor, cogwheel rigidity, bradykinesia, with signs symptoms most consistent with idiopathic Parkinson disease.  Patient will benefit from skilled PT to improve overall function and to address impairments and limitations listed below.  OBJECTIVE IMPAIRMENTS: decreased activity tolerance for ADL's, difficulty walking, decreased balance, decreased endurance, decreased mobility, decreased ROM, decreased strength, impaired flexibility, impaired UE/LE use  ACTIVITY LIMITATIONS: bending, liftting, walking, standing, cleaning, community activity, driving, reaching, carry, dressing  PERSONAL FACTORS: see above PMH  also affecting patient's functional outcome.  REHAB POTENTIAL: Good  CLINICAL DECISION MAKING: Evolving/moderate complexity  EVALUATION COMPLEXITY: Moderate    GOALS: Short term PT Goals Target date: 02/01/2024    Pt will be I and compliant with HEP. Baseline:  Goal status: MET 02/04/24  Pt will improve TUG time to less than 18 seconds to show improved gait speed and balance. Baseline: Goal status: ongoing 02/04/24  Long term PT goals Target date: 03/28/2024   Pt will improve TUG test to less than 15 seconds show improved gait speed, improved balance and decreased risk of falling Baseline: Goal status: MET 02/08/24 Pt will improve  strength to at least 4+/5 MMT to improve functional strength Baseline: Goal status: ongoing 02/04/24 Pt will improve Patient specific functional scale (PSFS) to at least /10 to show improved function level Baseline: Goal status: ongoing 02/04/24 Pt will improve BERG balance test to at least 47 points to show improved balance and decreased risk of falling Baseline: Goal status: New Pt will improve 5 times sit to stand test without UE support to less than 15 seconds to show improved leg strength and balance.   Baseline: Goal status: ongoing 02/04/24  PLAN: PT FREQUENCY: 1-3 times per week   PT DURATION: 6-8 weeks  PLANNED INTERVENTIONS  97110-Therapeutic exercises, 97530- Therapeutic activity, 97112- Neuromuscular re-education, 97535- Self Care, [id number]- Manual therapy, and [id number]- Gait training  PLAN FOR NEXT SESSION: PWR/BIG movements and gym activity for strength and endurance. Gait and balance NEXT MD VISIT: 02/08/24  Redell JONELLE Moose, PT, DPT 02/18/2024,  9:49 AM  "

## 2024-02-21 ENCOUNTER — Ambulatory Visit

## 2024-02-22 ENCOUNTER — Ambulatory Visit (HOSPITAL_COMMUNITY): Admitting: Speech Pathology

## 2024-02-22 ENCOUNTER — Encounter (HOSPITAL_COMMUNITY): Payer: Self-pay | Admitting: Speech Pathology

## 2024-02-22 DIAGNOSIS — R471 Dysarthria and anarthria: Secondary | ICD-10-CM

## 2024-02-22 DIAGNOSIS — R41841 Cognitive communication deficit: Secondary | ICD-10-CM

## 2024-02-22 NOTE — Therapy (Signed)
 " OUTPATIENT SPEECH LANGUAGE PATHOLOGY PARKINSON'S TREATMENT   Patient Name: Timothy Hahn MRN: [id number] DOB:1960/04/12, 64 y.o., male Today's Date: 02/22/2024  PCP: [name], MD REFERRING PROVIDER: [name], MD  END OF SESSION:  End of Session - 02/22/24 1334     Visit Number 2    Number of Visits 9    Date for Recertification  03/23/24    Authorization Type UHC/UMR PPO   DED 1,500 met  FM DED 3,000 (1,500 met)  OOP 4,000 (2,308.88 met)  Fm OOP 8,000 (2,308.88 met)  Co-ins 20%  Visit limit 30  [facility] Portal   SLP Start Time 1330    SLP Stop Time  1415    SLP Time Calculation (min) 45 min    Activity Tolerance Patient tolerated treatment well          Past Medical History:  Diagnosis Date   Asthma    as a child only   Gout    History of kidney stones    Hypertension    Past Surgical History:  Procedure Laterality Date   ANAL FISSURE REPAIR  01/20/2000   COLONOSCOPY  10/31/2010   Dr.Jacobs 10/2020   LITHOTRIPSY  01/20/2003   kidney stones   TRANSURETHRAL RESECTION OF PROSTATE N/A 01/23/2021   Procedure: TRANSURETHRAL RESECTION OF THE PROSTATE (TURP);  Surgeon: [name], MD;  Location: [facility];  Service: Urology;  Laterality: N/A;   Patient Active Problem List   Diagnosis Date Noted   Enlarged prostate with urinary obstruction 01/23/2021    ONSET DATE: ~11/2023  REFERRING DIAG: G20.A1 (ICD-10-CM) - Parkinson's disease without dyskinesia or fluctuating manifestations (HCC)  THERAPY DIAG:  Dysarthria and anarthria  Cognitive communication deficit  Rationale for Evaluation and Treatment: Rehabilitation  SUBJECTIVE:   SUBJECTIVE STATEMENT: I watched the video.  Pt accompanied by: self  PERTINENT HISTORY: 64 y.o. year old male with gradual onset since 2024 of progressive gait and balance difficulty, lightheadedness, dizziness, resting tremor, cogwheel rigidity, bradykinesia, masked facies with signs symptoms  most consistent with idiopathic Parkinson disease. He was referred for SLP evaluation and treatment by his neurologist, Dr. Eduard Margaret. He is receiving outpatient PT (PWR/BIG) at [facility] in [place], [place].   PAIN:  Are you having pain? No  FALLS: Has patient fallen in last 6 months?  No  LIVING ENVIRONMENT: Lives with: lives with their spouse Lives in: House/apartment  PLOF:  Level of assistance: Independent with ADLs, Independent with IADLs Employment: Retired  PATIENT GOALS: Improve vocal quality  OBJECTIVE:  Note: Objective measures were completed at Evaluation unless otherwise noted.  DIAGNOSTIC FINDINGS:  MRI BRAIN IMPRESSION: 1.  No evidence of an acute intracranial abnormality. 2. Chronic small vessel ischemic changes within the cerebral white matter, moderate for age. 3. Mild generalized cerebral atrophy. 4. Paranasal sinus disease as described. 5. Small left mastoid effusion.   Electronically Signed: By: Rockey Childs D.O. On: 08/24/2023 10:41  COGNITION: Overall cognitive status: Within functional limits for tasks assessed Areas of impairment: Attention Comments: Pt reports a shorter attention span and remembering certain details.  MOTOR SPEECH: Overall motor speech: impaired Level of impairment: Sentence Respiration: diaphragmatic/abdominal breathing Phonation: normal, hoarse, low vocal intensity, and mild Resonance: WFL Articulation: Appears intact Intelligibility: Intelligible Motor planning: Appears intact Motor speech errors: aware and unaware Interfering components: n/a Effective technique: increased vocal intensity  ORAL MOTOR EXAMINATION: Overall status: WFL Comments: masked face  OBJECTIVE VOICE ASSESSMENT: Sustained ah maximum phonation time: 23 seconds Sustained  ah loudness average: 84 dB Oral reading (passage) loudness average: 64 dB Oral reading loudness range: 57-68 dB Conversational loudness average: 62 dB Conversational  loudness range: 56-67 dB Voice quality: low vocal intensity and mild hoarseness quality Stimulability trials: Given SLP modeling and usual min cues, loudness average increased to 74dB (range of 58 to 78) in oral counting 1-10. Comments: Pt cued to speak OUT and speak with intent  Completed audio recording of patients baseline voice without cueing from SLP: Yes  Pt does report difficulty with swallowing which does warrant further evaluation.                                                                                                                          TREATMENT DATE: 02/22/24 Pt watched the Speakout! Therapy video and reports feeling comforted by being able to do something to combat PD. SLP introduced lesson 1 of Speakout! Therapy. After providing an overview, SLP modeled each exercise and had Pt return demonstrate. SLP provided verbal cues to use intent, speak out, and implement smooth and continuous speech for the warm up exercises. He was able to complete all exercises (warm up, counting, oral reading, and cognitive communication task) with min cues (visual and verbal). He practiced using intent in word description game at the end of the session and expressed enjoyment in doing so. He even completed a card trick at the end and cued the SLP to knock with intent! Continue to target goals.   PATIENT EDUCATION: Education details: Check his email for [facility]! Therapy exercises and complete Person educated: Patient Education method: Explanation, Demonstration, and Handouts Education comprehension: verbalized understanding  HOME EXERCISE PROGRAM: Pt will complete HEP as assigned to facilitate carryover of treatment strategies and techniques in home and community environment with written cues.  GOALS: Goals reviewed with patient? Yes  SHORT TERM GOALS: Target date: 03/23/2024  Pt will coordinate vocal and articulatory subsystems in hierarchical speech tasks by producing sounds with  intention with min assistance.   Baseline: Mi/mod assist Goal status: ONGOING  2. Generalize intentional speech to cognitive-linguistic exercises and conversational speech with improved vocal quality, loudness, articulatory precision, and endurance while maintaining a minimum of 85 dB with min assistance. Baseline: 62 dB Goal status: ONGOING  3.  Read phrases, sentences, and paragraphs with intention, yielding improved vocal quality, loudness, articulatory precision, and endurance while maintaining a minimum of 85 dB with min assistance.  Baseline: 64 dB Goal status: ONGOING  4.  Patient will maintain a maximum phonation duration of 10+ seconds or greater with minimal cues during sustained phonation over 10 trials. Baseline: 23 seconds x 1 trial Goal status: ONGOING  5.  The patient will increase loudness in conversational speech allowing them to converse in the community, increase voice use, slow progression of vocal deterioration, and improve their QOL with min cues from caregiver Baseline: mi/mod Goal status: ONGOING  LONG TERM GOALS: Target date: 04/19/2024  Pt will utilize intent across speech tasks in  a variety of environments with indirect cues as needed. Baseline: Introduced this date Goal status: ONGOING  ASSESSMENT:  CLINICAL IMPRESSION: Patient is a 64 y.o. male who was seen today for SLP evaluation in setting of Parkinson's Disease. He presents with mild hypokinetic dysarthria characterized by reduced vocal intensity, mild breathy/hoarse quality, and reduced ability to sustain breath support for lengthy conversations. Pt reports mild brain fog and difficulty remembering dates. He scored a 25/30 on the [facility] with deficits in attention (digits in reverse) and story recall. Recommend skilled SLP therapy to address hypokinetic dysarthria in the goals stated above. Pt is motivated to come to therapy and has excellent family support.   OBJECTIVE IMPAIRMENTS: (from initial  evaluation 02/17/24) Objective impairments include attention, dysarthria, and dysphagia. These impairments are limiting patient from effectively communicating at home and in community and safety when swallowing.Factors affecting potential to achieve goals and functional outcome are none. Patient will benefit from skilled SLP services to address above impairments and improve overall function.  REHAB POTENTIAL: Excellent  PLAN:  SLP FREQUENCY: 2x/week  SLP DURATION: 4 weeks  PLANNED INTERVENTIONS: Cueing hierachy, Internal/external aids, Functional tasks, Multimodal communication approach, SLP instruction and feedback, Compensatory strategies, Patient/family education, [phone] Treatment of speech (30 or 45 min) , and [phone] [phone] Artic, Phon, Eval Compre, Express    Latrish Mogel, CCC-SLP 02/22/2024, 1:36 PM      "

## 2024-02-24 ENCOUNTER — Ambulatory Visit (HOSPITAL_COMMUNITY): Admitting: Speech Pathology

## 2024-02-24 DIAGNOSIS — R471 Dysarthria and anarthria: Secondary | ICD-10-CM

## 2024-02-24 DIAGNOSIS — R41841 Cognitive communication deficit: Secondary | ICD-10-CM

## 2024-02-24 NOTE — Therapy (Signed)
 " OUTPATIENT SPEECH LANGUAGE PATHOLOGY PARKINSON'S TREATMENT   Patient Name: Timothy Hahn MRN: [id number] DOB:[birth year], 64 y.o., male Today's Date: 02/24/2024  PCP: [name], MD REFERRING PROVIDER: [name], MD  END OF SESSION:  End of Session - 02/24/24 1056     Visit Number 3    Number of Visits 9    Date for Recertification  03/23/24    Authorization Type UHC/UMR PPO   DED 1,500 met  FM DED 3,000 (1,500 met)  OOP 4,000 (2,308.88 met)  Fm OOP 8,000 (2,308.88 met)  Co-ins 20%  Visit limit 30  [facility] Portal   SLP Start Time 0910    SLP Stop Time  0954    SLP Time Calculation (min) 44 min    Activity Tolerance Patient tolerated treatment well          Past Medical History:  Diagnosis Date   Asthma    as a child only   Gout    History of kidney stones    Hypertension    Past Surgical History:  Procedure Laterality Date   ANAL FISSURE REPAIR  01/20/2000   COLONOSCOPY  10/31/2010   Dr.Jacobs 10/2020   LITHOTRIPSY  01/20/2003   kidney stones   TRANSURETHRAL RESECTION OF PROSTATE N/A 01/23/2021   Procedure: TRANSURETHRAL RESECTION OF THE PROSTATE (TURP);  Surgeon: [name], MD;  Location: [facility];  Service: Urology;  Laterality: N/A;   Patient Active Problem List   Diagnosis Date Noted   Enlarged prostate with urinary obstruction 01/23/2021    ONSET DATE: ~11/2023  REFERRING DIAG: G20.A1 (ICD-10-CM) - Parkinson's disease without dyskinesia or fluctuating manifestations (HCC)  THERAPY DIAG:  Dysarthria and anarthria  Cognitive communication deficit  Rationale for Evaluation and Treatment: Rehabilitation  SUBJECTIVE:   SUBJECTIVE STATEMENT: My wife did the exercises with me!  Pt accompanied by: self  PERTINENT HISTORY: 64 y.o. year old male with gradual onset since 2024 of progressive gait and balance difficulty, lightheadedness, dizziness, resting tremor, cogwheel rigidity, bradykinesia, masked facies with  signs symptoms most consistent with idiopathic Parkinson disease. He was referred for SLP evaluation and treatment by his neurologist, Dr. Eduard Margaret. He is receiving outpatient PT (PWR/BIG) at [facility] in [place], [place].   PAIN:  Are you having pain? No  FALLS: Has patient fallen in last 6 months?  No  LIVING ENVIRONMENT: Lives with: lives with their spouse Lives in: House/apartment  PLOF:  Level of assistance: Independent with ADLs, Independent with IADLs Employment: Retired  PATIENT GOALS: Improve vocal quality  OBJECTIVE:  Note: Objective measures were completed at Evaluation unless otherwise noted.  DIAGNOSTIC FINDINGS:  MRI BRAIN IMPRESSION: 1.  No evidence of an acute intracranial abnormality. 2. Chronic small vessel ischemic changes within the cerebral white matter, moderate for age. 3. Mild generalized cerebral atrophy. 4. Paranasal sinus disease as described. 5. Small left mastoid effusion.   Electronically Signed: By: Rockey Childs D.O. On: 08/24/2023 10:41  COGNITION: Overall cognitive status: Within functional limits for tasks assessed Areas of impairment: Attention Comments: Pt reports a shorter attention span and remembering certain details.  MOTOR SPEECH: Overall motor speech: impaired Level of impairment: Sentence Respiration: diaphragmatic/abdominal breathing Phonation: normal, hoarse, low vocal intensity, and mild Resonance: WFL Articulation: Appears intact Intelligibility: Intelligible Motor planning: Appears intact Motor speech errors: aware and unaware Interfering components: n/a Effective technique: increased vocal intensity  ORAL MOTOR EXAMINATION: Overall status: WFL Comments: masked face  OBJECTIVE VOICE ASSESSMENT: Sustained ah maximum phonation time:  23 seconds Sustained ah loudness average: 84 dB Oral reading (passage) loudness average: 64 dB Oral reading loudness range: 57-68 dB Conversational loudness average: 62  dB Conversational loudness range: 56-67 dB Voice quality: low vocal intensity and mild hoarseness quality Stimulability trials: Given SLP modeling and usual min cues, loudness average increased to 74dB (range of 58 to 78) in oral counting 1-10. Comments: Pt cued to speak OUT and speak with intent  Completed audio recording of patients baseline voice without cueing from SLP: Yes  Pt does report difficulty with swallowing which does warrant further evaluation.                                                                                                                          Previous Treatment: Pt watched the Speakout! Therapy video and reports feeling comforted by being able to do something to combat PD. SLP introduced lesson 1 of Speakout! Therapy. After providing an overview, SLP modeled each exercise and had Pt return demonstrate. SLP provided verbal cues to use intent, speak out, and implement smooth and continuous speech for the warm up exercises. He was able to complete all exercises (warm up, counting, oral reading, and cognitive communication task) with min cues (visual and verbal). He practiced using intent in word description game at the end of the session and expressed enjoyment in doing so. He even completed a card trick at the end and cued the SLP to knock with intent! Continue to target goals.   TREATMENT DATE: 02/24/24 Pt reports that he completed his SPEAKOUT! Therapy exercises at home and his wife joined in with him. He did not yet receive an email from [employer] with online access to the program. SLP will troubleshoot that. SLP recorded a sample of how to complete the vocal warm up and texted and emailed it to him so that he can share with his wife and practice at home. He benefited from initial model from SLP and mi/mod cues for projection and smooth and continuous. He then participated in conversation regarding other aspects of PD (nutrition, medications, and  exercise). He approaches sessions with good humor and motivation. Continue to target goals.  PATIENT EDUCATION: Education details: Check his email for [facility]! Therapy exercises and complete Person educated: Patient Education method: Explanation, Demonstration, and Handouts Education comprehension: verbalized understanding  HOME EXERCISE PROGRAM: Pt will complete HEP as assigned to facilitate carryover of treatment strategies and techniques in home and community environment with written cues.  GOALS: Goals reviewed with patient? Yes  SHORT TERM GOALS: Target date: 03/23/2024  Pt will coordinate vocal and articulatory subsystems in hierarchical speech tasks by producing sounds with intention with min assistance.   Baseline: Mi/mod assist Goal status: ONGOING  2. Generalize intentional speech to cognitive-linguistic exercises and conversational speech with improved vocal quality, loudness, articulatory precision, and endurance while maintaining a minimum of 85 dB with min assistance. Baseline: 62 dB Goal status: ONGOING  3.  Read phrases, sentences, and  paragraphs with intention, yielding improved vocal quality, loudness, articulatory precision, and endurance while maintaining a minimum of 85 dB with min assistance.  Baseline: 64 dB Goal status: ONGOING  4.  Patient will maintain a maximum phonation duration of 10+ seconds or greater with minimal cues during sustained phonation over 10 trials. Baseline: 23 seconds x 1 trial Goal status: ONGOING  5.  The patient will increase loudness in conversational speech allowing them to converse in the community, increase voice use, slow progression of vocal deterioration, and improve their QOL with min cues from caregiver Baseline: mi/mod Goal status: ONGOING  LONG TERM GOALS: Target date: 04/19/2024  Pt will utilize intent across speech tasks in a variety of environments with indirect cues as needed. Baseline: Introduced this date Goal  status: ONGOING  ASSESSMENT:  CLINICAL IMPRESSION: (from initial evaluation 02/17/2024) Patient is a 64 y.o. male who was seen today for SLP evaluation in setting of Parkinson's Disease. He presents with mild hypokinetic dysarthria characterized by reduced vocal intensity, mild breathy/hoarse quality, and reduced ability to sustain breath support for lengthy conversations. Pt reports mild brain fog and difficulty remembering dates. He scored a 25/30 on the [facility] with deficits in attention (digits in reverse) and story recall. Recommend skilled SLP therapy to address hypokinetic dysarthria in the goals stated above. Pt is motivated to come to therapy and has excellent family support.   OBJECTIVE IMPAIRMENTS:  Objective impairments include attention, dysarthria, and dysphagia. These impairments are limiting patient from effectively communicating at home and in community and safety when swallowing.Factors affecting potential to achieve goals and functional outcome are none. Patient will benefit from skilled SLP services to address above impairments and improve overall function.  REHAB POTENTIAL: Excellent  PLAN:  SLP FREQUENCY: 2x/week  SLP DURATION: 4 weeks  PLANNED INTERVENTIONS: Cueing hierachy, Internal/external aids, Functional tasks, Multimodal communication approach, SLP instruction and feedback, Compensatory strategies, Patient/family education, [phone] Treatment of speech (30 or 45 min) , and [id number]- Speech [address] Artic, Phon, Eval Compre, Express   Thank you,  Lamar Candy, CCC-SLP [phone]  Seabron Iannello, CCC-SLP 02/24/2024, 10:58 AM      "

## 2024-02-25 ENCOUNTER — Ambulatory Visit

## 2024-02-25 DIAGNOSIS — R2689 Other abnormalities of gait and mobility: Secondary | ICD-10-CM

## 2024-02-25 DIAGNOSIS — M6281 Muscle weakness (generalized): Secondary | ICD-10-CM

## 2024-02-25 NOTE — Therapy (Signed)
 " OUTPATIENT PHYSICAL THERAPY TREATMENT   Patient Name: Timothy Hahn MRN: [id number] DOB:09-03-1960, 64 y.o., male Today's Date: 01/04/2024  END OF SESSION:  PT End of Session - 02/25/24 0851     Visit Number 9    Number of Visits 16    Date for Recertification  03/28/24    Authorization Type [employer]    PT Start Time 0840    PT Stop Time 0925    PT Time Calculation (min) 45 min    Activity Tolerance Patient tolerated treatment well    Behavior During Therapy [facility] for tasks assessed/performed            Past Medical History:  Diagnosis Date   Asthma    as a child only   Gout    History of kidney stones    Hypertension    Past Surgical History:  Procedure Laterality Date   ANAL FISSURE REPAIR  01/20/2000   COLONOSCOPY  10/31/2010   Dr.Jacobs 10/2020   LITHOTRIPSY  01/20/2003   kidney stones   TRANSURETHRAL RESECTION OF PROSTATE N/A 01/23/2021   Procedure: TRANSURETHRAL RESECTION OF THE PROSTATE (TURP);  Surgeon: [name], MD;  Location: [facility];  Service: Urology;  Laterality: N/A;   Patient Active Problem List   Diagnosis Date Noted   Enlarged prostate with urinary obstruction 01/23/2021    PCP: [name], MD   REFERRING PROVIDER: [name], MD   REFERRING DIAG: G20.A1 (ICD-10-CM) - Parkinson's disease without dyskinesia or fluctuating manifestation   Rationale for Evaluation and Treatment:  Rehabiliation  THERAPY DIAG:  Other abnormalities of gait and mobility - Plan: PT plan of care cert/re-cert  Muscle weakness (generalized) - Plan: PT plan of care cert/re-cert  ONSET DATE: Last 6 months   SUBJECTIVE:                                                                                                                                                                                           SUBJECTIVE STATEMENT:  Pt reports doing well since last PT visit. He is currently participating in speech therapy and  working on voice projection.   From eval: He says for last 6 months he started having difficulty with his movements, brain fog, balance, feels uncoordinated. He gets some tremors at times. He is on medication now. He does get sleepy a lot. He does have some dizzy spells and balance deficits but denies falls   PERTINENT HISTORY:  See above PMH  PAIN:  Denies pain today at eval   PRECAUTIONS: ,  None  RED FLAGS: None   WEIGHT BEARING RESTRICTIONS:  No  FALLS:  Has patient fallen in last 6 months? No   OCCUPATION:  retired  PLOF:  Independent with basic ADLs  PATIENT GOALS:  Become stronger and improve activity  OBJECTIVE:  Note: Objective measures were completed at Evaluation unless otherwise noted.    PATIENT SURVEYS:  Patient-Specific Activity Scoring Scheme  0 represents unable to perform. 10 represents able to perform at prior level. 0 1 2 3 4 5 6 7 8 9  10 (Date and Score)  Activity Eval     1. Taking off clothes and putting back on 7    2. Balance 7    3. Standing and turning 5   4.    5.    Score 6.33/10    Total score = sum of the activity scores/number of activities Minimum detectable change (90%CI) for average score = 2 points Minimum detectable change (90%CI) for single activity score = 3 points   GAIT: Eval Assistive device utilized: None Level of assistance: Complete Independence Comments: mild gait unsteadiness at times, shuffling gait and lack of arm swing   UPPER EXTREMITY MMT: Eval 4/5 MMT grossly bilat    LOWER EXTREMITY MMT:   Eval 4/5 MMT grossly bilat    FUNCTIONAL TESTS:  01/04/24 5 times sit to stand: 26.6 seconds, without UE support Timed up and go (TUG): 21.07 seconds without UE support  02/08/24 TUG: 8.7 sec without UE support  Berg Balance Scale:  Item Test date: 01/04/24 Date:  Date:   Sitting to standing 4. able to stand without using hands and stabilize independently Insert SmartPhrase OPRCBERGREEVAL  Insert SmartPhrase OPRCBERGREEVAL  2. Standing unsupported 4. able to stand safely for 2 minutes    3. Sitting with back unsupported, feet supported 4. able to sit safely and securely for 2 minutes    4. Standing to sitting 3. controls descent by using hands    5. Pivot transfer  4. able to transfer safely with minor use of hands    6. Standing unsupported with eyes closed 3. able to stand 10 seconds with supervision    7. Standing unsupported with feet together 3. able to place feet together independently and stand 1 minute with supervision    8. Reaching forward with outstretched arms while standing 3. can reach forward 12 cm (5 inches)    9. Pick up object from the floor from standing 3. able to pick up slipper but needs supervision    10. Turning to look behind over left and right shoulders while standing 4. looks behind from both sides and weight shifts well    11. Turn 360 degrees 2. able to turn 360 degrees safely but slowly    12. Place alternate foot on step or stool while standing unsupported 3. able to stand independently and complete 8 steps in > 20 seconds     13. Standing unsupported one foot in front 2. able to take small step independently and hold 30 seconds    14. Standing on one leg 1. tries to lift leg unable to hold 3 seconds but remains standing independently.      Total Score 43/56 Total Score:    Total Score:  TREATMENT DATE:    02/25/24 Bike X 10 min Standing PWR! Up, rock, twist, fwd and bwd step x 1# 10 reps each Alternating toe taps 4 step 2 x 30 sec Cable walk FWD/BWD x 5 reps 22.5# Stairs reciprocal without UE support, 5 trips over and back   Blaze pods 3 pods SLS 30 sec X 2 bilat (24 was top score) Blaze pods 4 pods square drill, walking multidireciton to turn off with legs and arms  02/18/24 Bike X 10 min Standing PWR! Up,  rock, twist, fwd and bwd step  10 reps each Alternating toe taps 4 step 2 x 30 sec Stairs reciprocal without UE support, 5 trips over and back March walking 75 feet X 2 with CGA Backwards walking 75 feet X 2 with CGA Blaze pods 3 pods SLS 30 sec X 2 bilat (24 was top score) Blaze pods 4 pods square drill, walking multidireciton to turn off with legs and arms  02/08/24 TRUE machine Lvl 4 x 10 min total, interval training with increase in level and/or RPM periodically Standing PWR! Up, rock, twist, fwd and bwd step with two 1#; in sequence x 10 reps Fwd walking like you're skiing with 1# working on big reciprocal arm swing 3x50' Bwd walking working on big steps back 3x50' Alternating toe taps 4 step 2 x 30 sec Marching on airex pad 2 x 30 sec Hurdles x 3 trials each FWD (2 feet), (1 foot), lateral L<>R  02/04/24 TRUE machine Lvl 4 x 12 min total, interval training with increase in level and/or RPM periodically Standing PWR! Up, rock, twist, fwd step, lateral step, and bwd step x 10 each Blaze pods 3 pods SLS 30 sec X 2 bilat (25 was top score) Blaze pods 4 pods square drill, walking multidireciton to turn off with legs Blaze pods 4 pods lateral shuffle 2 colors to add cognitive dual task March walking in bars without UE support 3 round trips Retrowalking in bars without UE support 3 round trips.    02/01/24 TRUE machine Lvl 4 x 10 min total, interval training with increase in level and/or RPM periodically Standing PWR! Up, rock, twist, fwd and bwd step x6 each with two 1#; in sequence x5 Fwd walking like you're skiing with 1# working on big reciprocal arm swing 2x50' Bwd walking working on big steps back 2x50' Feet together on airex static standing eyes open x 30, head turns and head nods 2x30 each Feet apart on airex static standing eyes closed 2x 30, head turns and head nods x30 each Alternating toe taps 4 step 2 x 30 sec     PATIENT EDUCATION: Education details: HEP,  PT plan of care, selfcare Person educated: Patient Education method: Explanation, Demonstration, Verbal cues, and Handouts Education comprehension: verbalized understanding, further education recommended   HOME EXERCISE PROGRAM: 01/28/24: Eyes closed feet together and backwards walking  Access Code: [email or web address] URL: [email or web address] Date: 01/04/2024 Prepared by: Redell Moose  Exercises - Sit to Stand with Arm Swing  - 2 x daily - 6 x weekly - 1 sets - 10 reps - Sit to Stand with Immediate Step  - 2 x daily - 6 x weekly - 1 sets - 10 reps - Standing Reach to Side with Weight Shift  - 2 x daily - 6 x weekly - 3 sets - 10 reps - Standing Reach to Opposite Side with Weight Shift  - 2 x daily - 6 x weekly - 1 sets - 10 reps -  Staggered Stance Weight Shift with Arms Reaching  - 2 x daily - 6 x weekly - 1 sets - 10 reps - Step Forward with Arms Reaching to Sides  - 2 x daily - 6 x weekly - 3 sets - 10 reps - Step Sideways with Arms Reaching  - 2 x daily - 6 x weekly - 3 sets - 10 reps - Standing Tandem Balance with Counter Support  - 2 x daily - 6 x weekly - 1 sets - 3 reps - 30 hold  ASSESSMENT:  CLINICAL IMPRESSION:  PT continues to work on functional mobility, strength, and balance as it relates to gait and postural awareness and position.   From eval: Patient is 64 y.o. year old male here with gradual onset since 2024 of progressive gait and balance difficulty, lightheadedness, dizziness, resting tremor, cogwheel rigidity, bradykinesia, with signs symptoms most consistent with idiopathic Parkinson disease.  Patient will benefit from skilled PT to improve overall function and to address impairments and limitations listed below.  OBJECTIVE IMPAIRMENTS: decreased activity tolerance for ADL's, difficulty walking, decreased balance, decreased endurance, decreased mobility, decreased ROM, decreased strength, impaired flexibility, impaired UE/LE use  ACTIVITY LIMITATIONS:  bending, liftting, walking, standing, cleaning, community activity, driving, reaching, carry, dressing  PERSONAL FACTORS: see above PMH  also affecting patient's functional outcome.  REHAB POTENTIAL: Good  CLINICAL DECISION MAKING: Evolving/moderate complexity  EVALUATION COMPLEXITY: Moderate    GOALS: Short term PT Goals Target date: 02/01/2024    Pt will be I and compliant with HEP. Baseline:  Goal status: MET 02/04/24  Pt will improve TUG time to less than 18 seconds to show improved gait speed and balance. Baseline: Goal status: ongoing 02/04/24  Long term PT goals Target date: 03/28/2024   Pt will improve TUG test to less than 15 seconds show improved gait speed, improved balance and decreased risk of falling Baseline: Goal status: MET 02/08/24 Pt will improve  strength to at least 4+/5 MMT to improve functional strength Baseline: Goal status: ongoing 02/04/24 Pt will improve Patient specific functional scale (PSFS) to at least /10 to show improved function level Baseline: Goal status: ongoing 02/04/24 Pt will improve BERG balance test to at least 47 points to show improved balance and decreased risk of falling Baseline: Goal status: New Pt will improve 5 times sit to stand test without UE support to less than 15 seconds to show improved leg strength and balance.  Baseline: Goal status: ongoing 02/04/24  PLAN: PT FREQUENCY: 1-3 times per week   PT DURATION: 6-8 weeks  PLANNED INTERVENTIONS  97110-Therapeutic exercises, 97530- Therapeutic activity, 97112- Neuromuscular re-education, 97535- Self Care, [id number]- Manual therapy, and [id number]- Gait training  PLAN FOR NEXT SESSION: PWR/BIG movements and gym activity for strength and endurance. Gait and balance NEXT MD VISIT: 02/08/24  Massie Ada, PT, DPT 02/25/2024, 9:47 AM  "

## 2024-02-28 ENCOUNTER — Ambulatory Visit (HOSPITAL_COMMUNITY): Admitting: Speech Pathology

## 2024-03-02 ENCOUNTER — Ambulatory Visit (HOSPITAL_COMMUNITY): Admitting: Speech Pathology

## 2024-03-06 ENCOUNTER — Ambulatory Visit (HOSPITAL_COMMUNITY): Admitting: Speech Pathology

## 2024-03-09 ENCOUNTER — Ambulatory Visit (HOSPITAL_COMMUNITY): Admitting: Speech Pathology

## 2024-03-13 ENCOUNTER — Ambulatory Visit (HOSPITAL_COMMUNITY): Admitting: Speech Pathology

## 2024-03-16 ENCOUNTER — Ambulatory Visit (HOSPITAL_COMMUNITY): Admitting: Speech Pathology

## 2024-08-28 ENCOUNTER — Ambulatory Visit: Admitting: Diagnostic Neuroimaging
